# Patient Record
Sex: Male | Born: 1970 | Race: Black or African American | Hispanic: No | Marital: Single | Smoking: Former smoker
Health system: Southern US, Community
[De-identification: ages and names within clinical notes are randomized; demographics above are authoritative.]

## PROBLEM LIST (undated history)

## (undated) DIAGNOSIS — I4729 Other ventricular tachycardia: Secondary | ICD-10-CM

## (undated) DIAGNOSIS — K5090 Crohn's disease, unspecified, without complications: Secondary | ICD-10-CM

## (undated) DIAGNOSIS — D509 Iron deficiency anemia, unspecified: Secondary | ICD-10-CM

## (undated) DIAGNOSIS — I5022 Chronic systolic (congestive) heart failure: Secondary | ICD-10-CM

## (undated) DIAGNOSIS — K5792 Diverticulitis of intestine, part unspecified, without perforation or abscess without bleeding: Secondary | ICD-10-CM

## (undated) DIAGNOSIS — I472 Ventricular tachycardia: Secondary | ICD-10-CM

## (undated) DIAGNOSIS — F1911 Other psychoactive substance abuse, in remission: Secondary | ICD-10-CM

## (undated) DIAGNOSIS — I428 Other cardiomyopathies: Secondary | ICD-10-CM

## (undated) DIAGNOSIS — E669 Obesity, unspecified: Secondary | ICD-10-CM

## (undated) DIAGNOSIS — Z720 Tobacco use: Secondary | ICD-10-CM

## (undated) HISTORY — PX: COLON SURGERY: SHX602

## (undated) HISTORY — PX: OTHER SURGICAL HISTORY: SHX169

---

## 2005-07-14 ENCOUNTER — Emergency Department (HOSPITAL_COMMUNITY): Admission: EM | Admit: 2005-07-14 | Discharge: 2005-07-14 | Payer: Self-pay | Admitting: Emergency Medicine

## 2008-02-08 ENCOUNTER — Emergency Department (HOSPITAL_COMMUNITY): Admission: EM | Admit: 2008-02-08 | Discharge: 2008-02-08 | Payer: Self-pay | Admitting: Emergency Medicine

## 2008-02-14 ENCOUNTER — Inpatient Hospital Stay (HOSPITAL_COMMUNITY): Admission: EM | Admit: 2008-02-14 | Discharge: 2008-02-16 | Payer: Self-pay | Admitting: Emergency Medicine

## 2009-08-27 ENCOUNTER — Emergency Department (HOSPITAL_COMMUNITY): Admission: EM | Admit: 2009-08-27 | Discharge: 2009-08-27 | Payer: Self-pay | Admitting: Emergency Medicine

## 2009-12-15 ENCOUNTER — Encounter: Payer: Self-pay | Admitting: Emergency Medicine

## 2009-12-15 ENCOUNTER — Inpatient Hospital Stay (HOSPITAL_COMMUNITY): Admission: AD | Admit: 2009-12-15 | Discharge: 2009-12-16 | Payer: Self-pay

## 2009-12-20 ENCOUNTER — Emergency Department (HOSPITAL_COMMUNITY): Admission: EM | Admit: 2009-12-20 | Discharge: 2009-12-20 | Payer: Self-pay | Admitting: Emergency Medicine

## 2009-12-25 ENCOUNTER — Inpatient Hospital Stay (HOSPITAL_COMMUNITY): Admission: EM | Admit: 2009-12-25 | Discharge: 2009-12-30 | Payer: Self-pay | Admitting: Emergency Medicine

## 2010-01-01 ENCOUNTER — Encounter: Admission: RE | Admit: 2010-01-01 | Discharge: 2010-01-01 | Payer: Self-pay | Admitting: General Surgery

## 2010-01-01 ENCOUNTER — Inpatient Hospital Stay (HOSPITAL_COMMUNITY): Admission: EM | Admit: 2010-01-01 | Discharge: 2010-02-10 | Payer: Self-pay | Admitting: Emergency Medicine

## 2010-01-11 ENCOUNTER — Encounter (INDEPENDENT_AMBULATORY_CARE_PROVIDER_SITE_OTHER): Payer: Self-pay

## 2010-01-31 ENCOUNTER — Encounter (INDEPENDENT_AMBULATORY_CARE_PROVIDER_SITE_OTHER): Payer: Self-pay

## 2010-02-21 ENCOUNTER — Emergency Department (HOSPITAL_COMMUNITY): Admission: EM | Admit: 2010-02-21 | Discharge: 2010-02-21 | Payer: Self-pay | Admitting: Emergency Medicine

## 2010-03-09 ENCOUNTER — Inpatient Hospital Stay (HOSPITAL_COMMUNITY): Admission: EM | Admit: 2010-03-09 | Discharge: 2010-03-11 | Payer: Self-pay | Admitting: Emergency Medicine

## 2010-03-13 ENCOUNTER — Inpatient Hospital Stay (HOSPITAL_COMMUNITY): Admission: EM | Admit: 2010-03-13 | Discharge: 2010-03-17 | Payer: Self-pay | Admitting: Emergency Medicine

## 2010-03-23 ENCOUNTER — Emergency Department (HOSPITAL_COMMUNITY): Admission: EM | Admit: 2010-03-23 | Discharge: 2010-03-24 | Payer: Self-pay | Admitting: Emergency Medicine

## 2010-03-28 ENCOUNTER — Emergency Department (HOSPITAL_COMMUNITY): Admission: EM | Admit: 2010-03-28 | Discharge: 2010-03-28 | Payer: Self-pay | Admitting: Emergency Medicine

## 2010-04-02 ENCOUNTER — Inpatient Hospital Stay (HOSPITAL_COMMUNITY): Admission: EM | Admit: 2010-04-02 | Discharge: 2010-04-06 | Payer: Self-pay | Admitting: Emergency Medicine

## 2010-04-12 ENCOUNTER — Emergency Department (HOSPITAL_COMMUNITY): Admission: EM | Admit: 2010-04-12 | Discharge: 2010-04-12 | Payer: Self-pay | Admitting: Emergency Medicine

## 2010-04-17 ENCOUNTER — Encounter: Admission: RE | Admit: 2010-04-17 | Discharge: 2010-04-17 | Payer: Self-pay | Admitting: Surgery

## 2010-04-17 ENCOUNTER — Inpatient Hospital Stay (HOSPITAL_COMMUNITY): Admission: EM | Admit: 2010-04-17 | Discharge: 2010-04-19 | Payer: Self-pay | Admitting: Emergency Medicine

## 2010-04-23 ENCOUNTER — Emergency Department (HOSPITAL_COMMUNITY): Admission: EM | Admit: 2010-04-23 | Discharge: 2010-04-24 | Payer: Self-pay | Admitting: Emergency Medicine

## 2010-04-29 ENCOUNTER — Observation Stay (HOSPITAL_COMMUNITY): Admission: EM | Admit: 2010-04-29 | Discharge: 2010-04-30 | Payer: Self-pay | Admitting: Emergency Medicine

## 2010-05-03 ENCOUNTER — Observation Stay (HOSPITAL_COMMUNITY): Admission: EM | Admit: 2010-05-03 | Discharge: 2010-05-04 | Payer: Self-pay | Admitting: Emergency Medicine

## 2010-05-08 ENCOUNTER — Emergency Department (HOSPITAL_COMMUNITY): Admission: EM | Admit: 2010-05-08 | Discharge: 2010-05-08 | Payer: Self-pay | Admitting: Emergency Medicine

## 2010-05-11 ENCOUNTER — Emergency Department (HOSPITAL_COMMUNITY): Admission: EM | Admit: 2010-05-11 | Discharge: 2010-05-11 | Payer: Self-pay | Admitting: Emergency Medicine

## 2010-05-15 ENCOUNTER — Emergency Department (HOSPITAL_COMMUNITY): Admission: EM | Admit: 2010-05-15 | Discharge: 2010-05-15 | Payer: Self-pay | Admitting: Emergency Medicine

## 2010-05-17 ENCOUNTER — Encounter (HOSPITAL_COMMUNITY): Admission: RE | Admit: 2010-05-17 | Discharge: 2010-07-11 | Payer: Self-pay | Admitting: Gastroenterology

## 2010-05-22 ENCOUNTER — Emergency Department (HOSPITAL_COMMUNITY): Admission: EM | Admit: 2010-05-22 | Discharge: 2010-05-22 | Payer: Self-pay | Admitting: Emergency Medicine

## 2010-05-25 ENCOUNTER — Emergency Department (HOSPITAL_COMMUNITY): Admission: EM | Admit: 2010-05-25 | Discharge: 2010-05-26 | Payer: Self-pay | Admitting: Emergency Medicine

## 2010-06-03 ENCOUNTER — Emergency Department (HOSPITAL_COMMUNITY): Admission: EM | Admit: 2010-06-03 | Discharge: 2010-06-03 | Payer: Self-pay | Admitting: Emergency Medicine

## 2010-06-10 ENCOUNTER — Emergency Department (HOSPITAL_COMMUNITY): Admission: EM | Admit: 2010-06-10 | Discharge: 2010-06-10 | Payer: Self-pay | Admitting: Emergency Medicine

## 2010-06-16 ENCOUNTER — Emergency Department (HOSPITAL_COMMUNITY): Admission: EM | Admit: 2010-06-16 | Discharge: 2010-06-16 | Payer: Self-pay | Admitting: Emergency Medicine

## 2010-06-19 ENCOUNTER — Encounter: Admission: RE | Admit: 2010-06-19 | Discharge: 2010-06-19 | Payer: Self-pay | Admitting: Surgery

## 2010-06-22 ENCOUNTER — Emergency Department (HOSPITAL_COMMUNITY): Admission: EM | Admit: 2010-06-22 | Discharge: 2010-06-22 | Payer: Self-pay | Admitting: Emergency Medicine

## 2010-06-27 ENCOUNTER — Inpatient Hospital Stay (HOSPITAL_COMMUNITY): Admission: EM | Admit: 2010-06-27 | Discharge: 2010-06-28 | Payer: Self-pay | Admitting: Emergency Medicine

## 2010-07-01 ENCOUNTER — Emergency Department (HOSPITAL_COMMUNITY): Admission: EM | Admit: 2010-07-01 | Discharge: 2010-07-02 | Payer: Self-pay | Admitting: Emergency Medicine

## 2010-07-04 ENCOUNTER — Emergency Department (HOSPITAL_COMMUNITY): Admission: EM | Admit: 2010-07-04 | Discharge: 2010-07-04 | Payer: Self-pay | Admitting: Emergency Medicine

## 2010-07-06 ENCOUNTER — Emergency Department (HOSPITAL_COMMUNITY): Admission: EM | Admit: 2010-07-06 | Discharge: 2010-07-07 | Payer: Self-pay | Admitting: Emergency Medicine

## 2010-07-12 ENCOUNTER — Emergency Department (HOSPITAL_COMMUNITY): Admission: EM | Admit: 2010-07-12 | Discharge: 2010-07-12 | Payer: Self-pay | Admitting: Emergency Medicine

## 2010-07-21 ENCOUNTER — Emergency Department (HOSPITAL_COMMUNITY): Admission: EM | Admit: 2010-07-21 | Discharge: 2010-07-21 | Payer: Self-pay | Admitting: Emergency Medicine

## 2010-07-26 ENCOUNTER — Inpatient Hospital Stay (HOSPITAL_COMMUNITY): Admission: RE | Admit: 2010-07-26 | Discharge: 2010-08-01 | Payer: Self-pay | Admitting: Surgery

## 2010-07-26 ENCOUNTER — Encounter (INDEPENDENT_AMBULATORY_CARE_PROVIDER_SITE_OTHER): Payer: Self-pay | Admitting: Surgery

## 2010-08-15 ENCOUNTER — Emergency Department (HOSPITAL_COMMUNITY): Admission: EM | Admit: 2010-08-15 | Discharge: 2010-08-16 | Payer: Self-pay | Admitting: Emergency Medicine

## 2010-08-20 ENCOUNTER — Emergency Department (HOSPITAL_COMMUNITY): Admission: EM | Admit: 2010-08-20 | Discharge: 2010-08-20 | Payer: Self-pay | Admitting: Emergency Medicine

## 2010-08-28 ENCOUNTER — Emergency Department (HOSPITAL_COMMUNITY): Admission: EM | Admit: 2010-08-28 | Discharge: 2010-08-29 | Payer: Self-pay | Admitting: Emergency Medicine

## 2010-09-06 ENCOUNTER — Emergency Department (HOSPITAL_COMMUNITY): Admission: EM | Admit: 2010-09-06 | Discharge: 2010-09-06 | Payer: Self-pay | Admitting: Emergency Medicine

## 2010-09-14 ENCOUNTER — Emergency Department (HOSPITAL_COMMUNITY): Admission: EM | Admit: 2010-09-14 | Discharge: 2010-09-14 | Payer: Self-pay | Admitting: Emergency Medicine

## 2010-09-24 ENCOUNTER — Emergency Department (HOSPITAL_COMMUNITY): Admission: EM | Admit: 2010-09-24 | Discharge: 2010-09-25 | Payer: Self-pay | Admitting: Emergency Medicine

## 2010-09-30 ENCOUNTER — Emergency Department (HOSPITAL_COMMUNITY): Admission: EM | Admit: 2010-09-30 | Discharge: 2010-09-30 | Payer: Self-pay | Admitting: Emergency Medicine

## 2010-10-10 ENCOUNTER — Emergency Department (HOSPITAL_COMMUNITY): Admission: EM | Admit: 2010-10-10 | Discharge: 2010-10-10 | Payer: Self-pay | Admitting: Emergency Medicine

## 2010-10-17 ENCOUNTER — Emergency Department (HOSPITAL_COMMUNITY): Admission: EM | Admit: 2010-10-17 | Discharge: 2010-10-17 | Payer: Self-pay | Admitting: Emergency Medicine

## 2010-10-27 ENCOUNTER — Emergency Department (HOSPITAL_COMMUNITY): Admission: EM | Admit: 2010-10-27 | Discharge: 2010-10-27 | Payer: Self-pay | Admitting: Emergency Medicine

## 2010-11-08 ENCOUNTER — Inpatient Hospital Stay (HOSPITAL_COMMUNITY): Admission: EM | Admit: 2010-11-08 | Discharge: 2010-02-19 | Payer: Self-pay | Admitting: Emergency Medicine

## 2010-11-12 ENCOUNTER — Emergency Department (HOSPITAL_COMMUNITY)
Admission: EM | Admit: 2010-11-12 | Discharge: 2010-11-12 | Payer: Self-pay | Source: Home / Self Care | Admitting: Emergency Medicine

## 2010-11-16 ENCOUNTER — Emergency Department (HOSPITAL_COMMUNITY)
Admission: EM | Admit: 2010-11-16 | Discharge: 2010-11-16 | Payer: Self-pay | Source: Home / Self Care | Admitting: Emergency Medicine

## 2010-11-23 ENCOUNTER — Emergency Department (HOSPITAL_COMMUNITY)
Admission: EM | Admit: 2010-11-23 | Discharge: 2010-11-23 | Payer: Self-pay | Source: Home / Self Care | Admitting: Emergency Medicine

## 2010-12-23 ENCOUNTER — Encounter: Payer: Self-pay | Admitting: Surgery

## 2011-02-11 LAB — DIFFERENTIAL
Basophils Relative: 1 % (ref 0–1)
Eosinophils Absolute: 0.3 10*3/uL (ref 0.0–0.7)
Eosinophils Relative: 4 % (ref 0–5)
Lymphocytes Relative: 34 % (ref 12–46)
Lymphs Abs: 1.8 10*3/uL (ref 0.7–4.0)
Lymphs Abs: 2.4 10*3/uL (ref 0.7–4.0)
Monocytes Relative: 13 % — ABNORMAL HIGH (ref 3–12)
Neutro Abs: 2.5 10*3/uL (ref 1.7–7.7)
Neutrophils Relative %: 45 % (ref 43–77)
Neutrophils Relative %: 47 % (ref 43–77)

## 2011-02-11 LAB — COMPREHENSIVE METABOLIC PANEL
ALT: 13 U/L (ref 0–53)
AST: 16 U/L (ref 0–37)
Alkaline Phosphatase: 91 U/L (ref 39–117)
CO2: 24 mEq/L (ref 19–32)
CO2: 27 mEq/L (ref 19–32)
Calcium: 8.6 mg/dL (ref 8.4–10.5)
Calcium: 8.7 mg/dL (ref 8.4–10.5)
Chloride: 105 mEq/L (ref 96–112)
Creatinine, Ser: 0.87 mg/dL (ref 0.4–1.5)
GFR calc Af Amer: >60 mL/min (ref >60)
GFR calc non Af Amer: >60 mL/min (ref >60)
GFR calc non Af Amer: >60 mL/min (ref >60)
Glucose, Bld: 90 mg/dL (ref 70–99)
Glucose, Bld: 93 mg/dL (ref 70–99)
Potassium: 3 mEq/L — ABNORMAL LOW (ref 3.5–5.1)
Sodium: 139 mEq/L (ref 135–145)
Total Protein: 7 g/dL (ref 6.0–8.3)

## 2011-02-11 LAB — CBC
HCT: 36.4 % — ABNORMAL LOW (ref 39.0–52.0)
HCT: 36.5 % — ABNORMAL LOW (ref 39.0–52.0)
Hemoglobin: 11.3 g/dL — ABNORMAL LOW (ref 13.0–17.0)
Hemoglobin: 11.5 g/dL — ABNORMAL LOW (ref 13.0–17.0)
MCH: 24.6 pg — ABNORMAL LOW (ref 26.0–34.0)
MCHC: 31 g/dL (ref 30.0–36.0)
MCHC: 31.6 g/dL (ref 30.0–36.0)
RBC: 4.67 MIL/uL (ref 4.22–5.81)
RDW: 15.6 % — ABNORMAL HIGH (ref 11.5–15.5)
WBC: 6.3 10*3/uL (ref 4.0–10.5)

## 2011-02-11 LAB — LIPASE, BLOOD
Lipase: 44 U/L (ref 11–59)
Lipase: 63 U/L — ABNORMAL HIGH (ref 11–59)

## 2011-02-12 LAB — CBC
HCT: 35.7 % — ABNORMAL LOW (ref 39.0–52.0)
HCT: 36.7 % — ABNORMAL LOW (ref 39.0–52.0)
HCT: 38.6 % — ABNORMAL LOW (ref 39.0–52.0)
Hemoglobin: 11 g/dL — ABNORMAL LOW (ref 13.0–17.0)
Hemoglobin: 11.2 g/dL — ABNORMAL LOW (ref 13.0–17.0)
MCH: 24.8 pg — ABNORMAL LOW (ref 26.0–34.0)
MCHC: 30 g/dL (ref 30.0–36.0)
MCV: 78.9 fL (ref 78.0–100.0)
MCV: 81 fL (ref 78.0–100.0)
Platelets: 379 10*3/uL (ref 150–400)
RBC: 4.6 MIL/uL (ref 4.22–5.81)
RDW: 14.8 % (ref 11.5–15.5)
RDW: 15.5 % (ref 11.5–15.5)
WBC: 4.4 10*3/uL (ref 4.0–10.5)
WBC: 5.6 10*3/uL (ref 4.0–10.5)
WBC: 6.1 10*3/uL (ref 4.0–10.5)

## 2011-02-12 LAB — URINALYSIS, ROUTINE W REFLEX MICROSCOPIC
Bilirubin Urine: NEGATIVE
Bilirubin Urine: NEGATIVE
Glucose, UA: NEGATIVE mg/dL
Glucose, UA: NEGATIVE mg/dL
Hgb urine dipstick: NEGATIVE
Ketones, ur: NEGATIVE mg/dL
Ketones, ur: NEGATIVE mg/dL
Ketones, ur: NEGATIVE mg/dL
Leukocytes, UA: NEGATIVE
Leukocytes, UA: NEGATIVE
Nitrite: NEGATIVE
Nitrite: NEGATIVE
Protein, ur: NEGATIVE mg/dL
Protein, ur: NEGATIVE mg/dL
Specific Gravity, Urine: 1.027 (ref 1.005–1.030)
Specific Gravity, Urine: 1.03 (ref 1.005–1.030)
Urobilinogen, UA: 1 mg/dL (ref 0.0–1.0)
pH: 5.5 (ref 5.0–8.0)
pH: 6 (ref 5.0–8.0)
pH: 6 (ref 5.0–8.0)

## 2011-02-12 LAB — COMPREHENSIVE METABOLIC PANEL
ALT: 11 U/L (ref 0–53)
ALT: 16 U/L (ref 0–53)
AST: 18 U/L (ref 0–37)
AST: 23 U/L (ref 0–37)
Albumin: 3.7 g/dL (ref 3.5–5.2)
Alkaline Phosphatase: 100 U/L (ref 39–117)
Alkaline Phosphatase: 100 U/L (ref 39–117)
Alkaline Phosphatase: 122 U/L — ABNORMAL HIGH (ref 39–117)
BUN: 5 mg/dL — ABNORMAL LOW (ref 6–23)
BUN: 6 mg/dL (ref 6–23)
CO2: 24 mEq/L (ref 19–32)
CO2: 26 mEq/L (ref 19–32)
Calcium: 8.8 mg/dL (ref 8.4–10.5)
Chloride: 104 mEq/L (ref 96–112)
Creatinine, Ser: 0.91 mg/dL (ref 0.4–1.5)
GFR calc Af Amer: >60 mL/min (ref >60)
GFR calc Af Amer: >60 mL/min (ref >60)
GFR calc non Af Amer: >60 mL/min (ref >60)
GFR calc non Af Amer: >60 mL/min (ref >60)
Glucose, Bld: 100 mg/dL — ABNORMAL HIGH (ref 70–99)
Glucose, Bld: 104 mg/dL — ABNORMAL HIGH (ref 70–99)
Glucose, Bld: 130 mg/dL — ABNORMAL HIGH (ref 70–99)
Glucose, Bld: 91 mg/dL (ref 70–99)
Potassium: 2.9 mEq/L — ABNORMAL LOW (ref 3.5–5.1)
Potassium: 3 mEq/L — ABNORMAL LOW (ref 3.5–5.1)
Potassium: 3.4 mEq/L — ABNORMAL LOW (ref 3.5–5.1)
Sodium: 137 mEq/L (ref 135–145)
Sodium: 141 mEq/L (ref 135–145)
Total Bilirubin: 0.9 mg/dL (ref 0.3–1.2)
Total Bilirubin: 1.2 mg/dL (ref 0.3–1.2)
Total Protein: 6.7 g/dL (ref 6.0–8.3)
Total Protein: 6.8 g/dL (ref 6.0–8.3)
Total Protein: 6.9 g/dL (ref 6.0–8.3)

## 2011-02-12 LAB — URINE MICROSCOPIC-ADD ON

## 2011-02-12 LAB — DIFFERENTIAL
Basophils Absolute: 0.1 10*3/uL (ref 0.0–0.1)
Basophils Absolute: 0.1 10*3/uL (ref 0.0–0.1)
Basophils Relative: 1 % (ref 0–1)
Basophils Relative: 2 % — ABNORMAL HIGH (ref 0–1)
Basophils Relative: 2 % — ABNORMAL HIGH (ref 0–1)
Eosinophils Absolute: 0.3 10*3/uL (ref 0.0–0.7)
Eosinophils Absolute: 0.3 10*3/uL (ref 0.0–0.7)
Eosinophils Relative: 5 % (ref 0–5)
Eosinophils Relative: 7 % — ABNORMAL HIGH (ref 0–5)
Lymphocytes Relative: 29 % (ref 12–46)
Lymphs Abs: 1.3 10*3/uL (ref 0.7–4.0)
Lymphs Abs: 1.4 10*3/uL (ref 0.7–4.0)
Lymphs Abs: 1.8 10*3/uL (ref 0.7–4.0)
Monocytes Relative: 10 % (ref 3–12)
Monocytes Relative: 14 % — ABNORMAL HIGH (ref 3–12)
Neutro Abs: 2.4 10*3/uL (ref 1.7–7.7)
Neutro Abs: 2.8 10*3/uL (ref 1.7–7.7)
Neutrophils Relative %: 45 % (ref 43–77)
Neutrophils Relative %: 47 % (ref 43–77)
Neutrophils Relative %: 50 % (ref 43–77)

## 2011-02-12 LAB — LIPASE, BLOOD
Lipase: 54 U/L (ref 11–59)
Lipase: 64 U/L — ABNORMAL HIGH (ref 11–59)
Lipase: 70 U/L — ABNORMAL HIGH (ref 11–59)

## 2011-02-13 LAB — DIFFERENTIAL
Basophils Relative: 1 % (ref 0–1)
Eosinophils Absolute: 0.3 10*3/uL (ref 0.0–0.7)
Eosinophils Relative: 7 % — ABNORMAL HIGH (ref 0–5)
Lymphs Abs: 1.4 10*3/uL (ref 0.7–4.0)

## 2011-02-13 LAB — POCT I-STAT, CHEM 8
BUN: 4 mg/dL — ABNORMAL LOW (ref 6–23)
Calcium, Ion: 1.08 mmol/L — ABNORMAL LOW (ref 1.12–1.32)
Creatinine, Ser: 0.8 mg/dL (ref 0.4–1.5)
Creatinine, Ser: 0.9 mg/dL (ref 0.4–1.5)
Glucose, Bld: 131 mg/dL — ABNORMAL HIGH (ref 70–99)
Hemoglobin: 14.3 g/dL (ref 13.0–17.0)
Sodium: 139 mEq/L (ref 135–145)
TCO2: 25 mmol/L (ref 0–100)
TCO2: 26 mmol/L (ref 0–100)

## 2011-02-13 LAB — CBC
MCH: 26.2 pg (ref 26.0–34.0)
MCHC: 32 g/dL (ref 30.0–36.0)
MCV: 81.8 fL (ref 78.0–100.0)
Platelets: 297 10*3/uL (ref 150–400)
RBC: 4.51 MIL/uL (ref 4.22–5.81)
RDW: 14.3 % (ref 11.5–15.5)

## 2011-02-13 LAB — URINALYSIS, ROUTINE W REFLEX MICROSCOPIC
Bilirubin Urine: NEGATIVE
Hgb urine dipstick: NEGATIVE
Nitrite: NEGATIVE
Protein, ur: 30 mg/dL — AB
Protein, ur: NEGATIVE mg/dL
Specific Gravity, Urine: 1.022 (ref 1.005–1.030)
Urobilinogen, UA: 0.2 mg/dL (ref 0.0–1.0)
Urobilinogen, UA: 1 mg/dL (ref 0.0–1.0)

## 2011-02-13 LAB — URINE CULTURE
Colony Count: NO GROWTH
Culture: NO GROWTH

## 2011-02-13 LAB — URINE MICROSCOPIC-ADD ON

## 2011-02-13 IMAGING — CR DG CHEST 1V
1 series · 1 of 1 positions shown · non-contrast
Comparison: Same day

CLINICAL DATA: Followup left thoracentesis.

CHEST - 1 VIEW

[w chest pa]
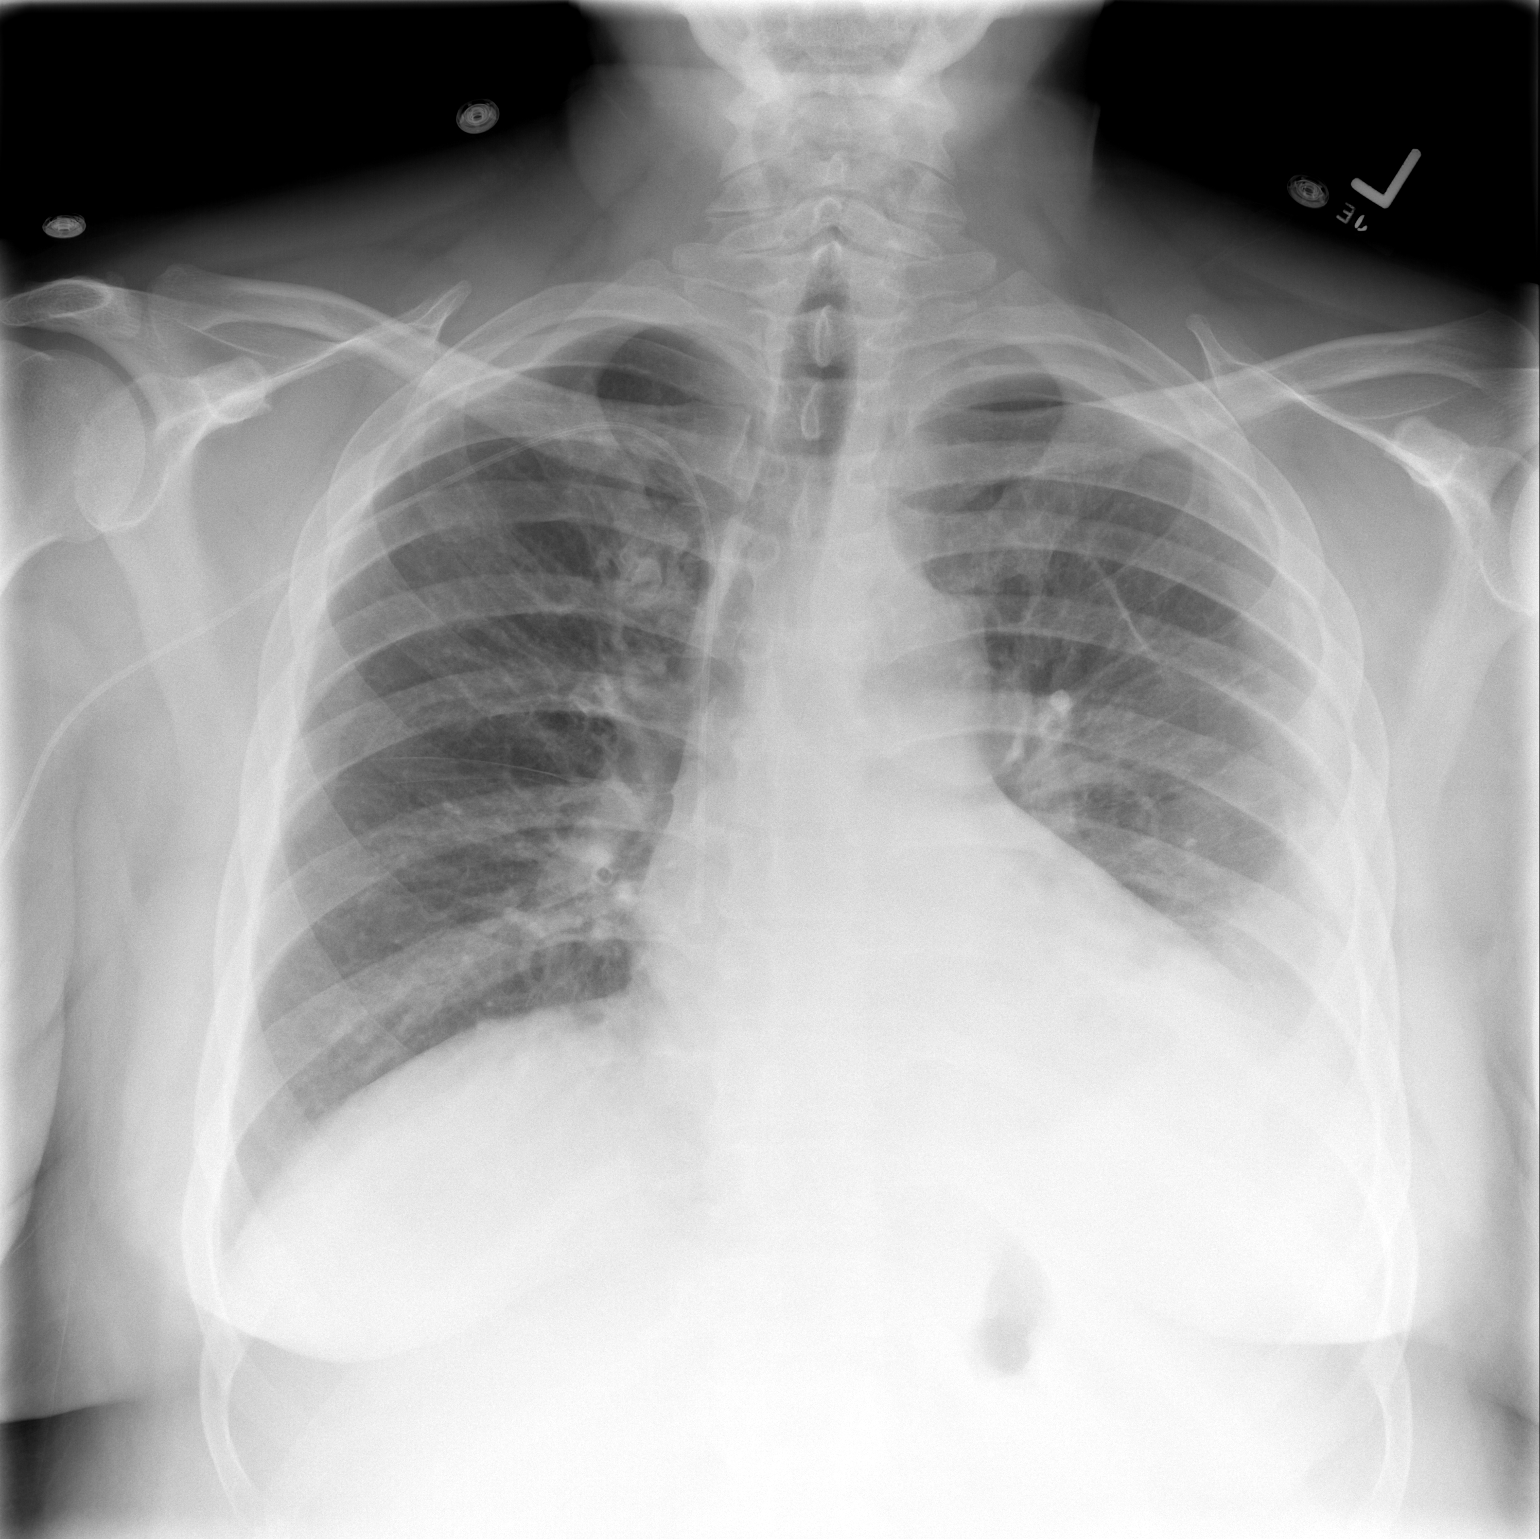

[1 of 1 positions shown; findings below may reference images not displayed]

FINDINGS: There is less pleural fluid on the left.  No
pneumothorax.  There is better aeration of the left lower lobe.
Right chest remains clear.  The catheter has its tip at the SVC/RA
junction or just within the right atrium.
IMPRESSION: Less pleural fluid on the left.  No pneumothorax.

## 2011-02-13 IMAGING — US US PARACENTESIS
1 series · 7 of 7 positions shown · non-contrast
Comparison: none

CLINICAL DATA: Left pleural effusion

LEFT ULTRASOUND-GUIDED THORACENTESIS
TECHNIQUE: An ultrasound-guided thoracentesis was thoroughly
discussed with the patient and questions answered.  The benefits,
risks, alternatives and complications were also discussed.  The
patient understands and wishes to proceed with the procedure.  A
verbal as well as written consent was obtained. Ultrasound was
performed to localize and mark an adequate pocket of fluid for
thoracentesis.  The left chest wall was prepped and draped in the
normal sterile fashion.  1% Lidocaine was used for local
anesthesia.  Under ultrasound guidance a 19-gauge Yueh catheter was
introduced yielding approximately 800 ml of blood-tinged fluid.
The patient tolerated the procedure well and there were no
immediate complications. Post procedure chest x-ray is pending.

[Series 1: us paracentesis · 0.31mm/px · 7 of 7 slices shown]
[im 1/7]
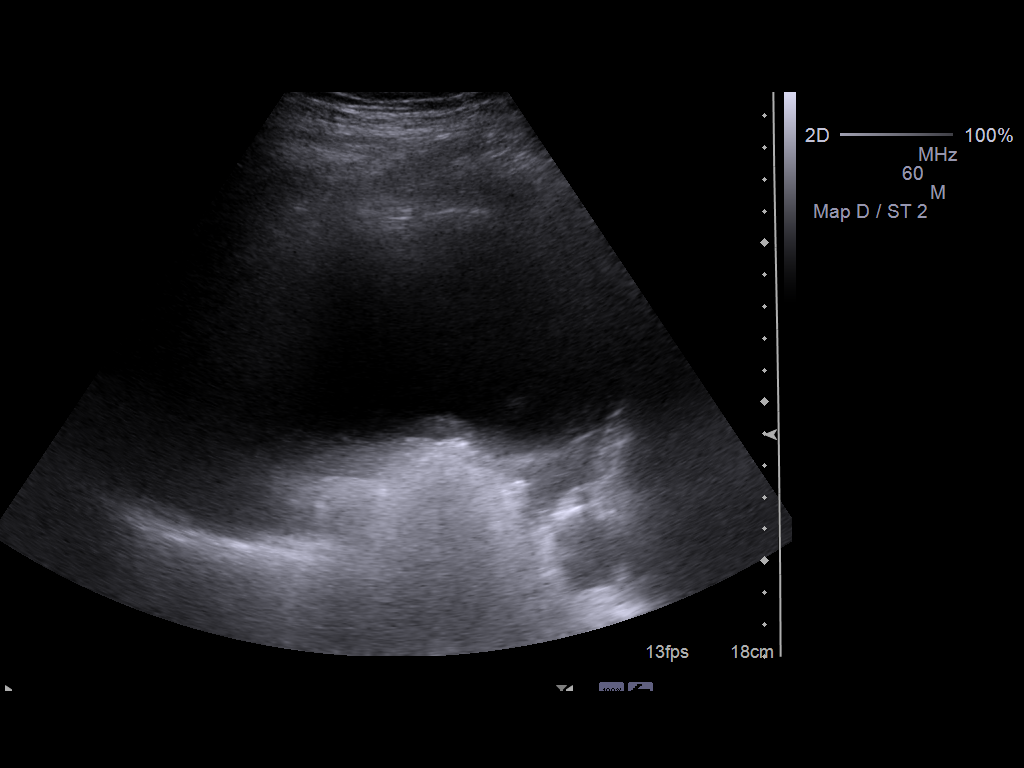
[im 2/7]
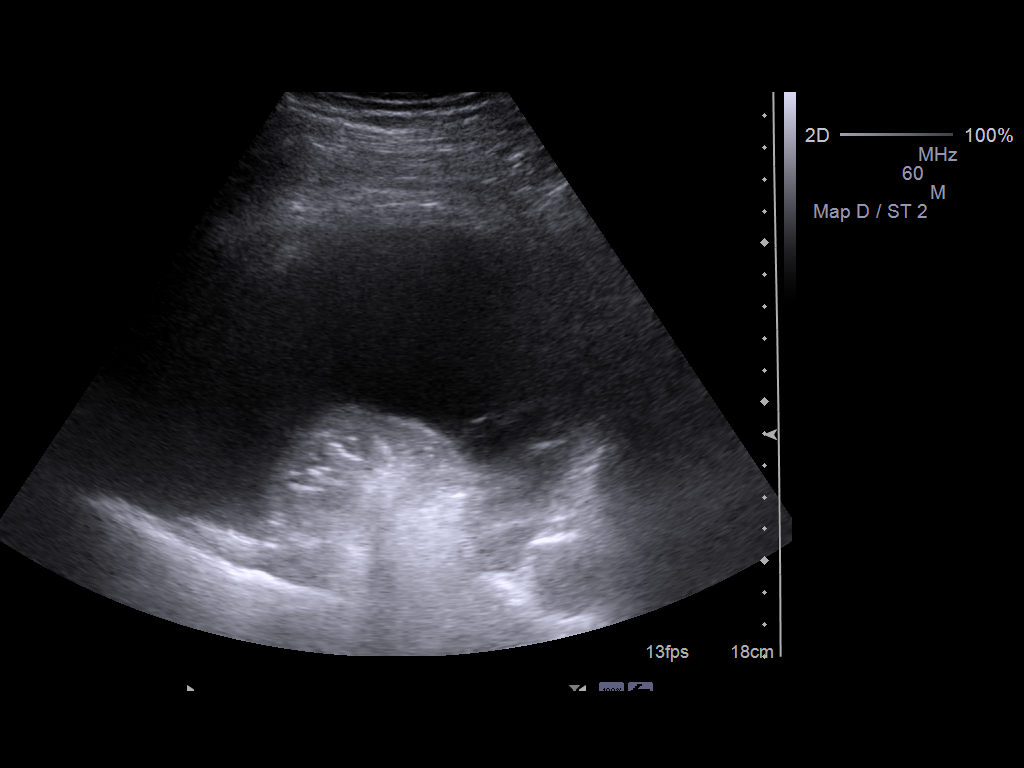
[im 3/7]
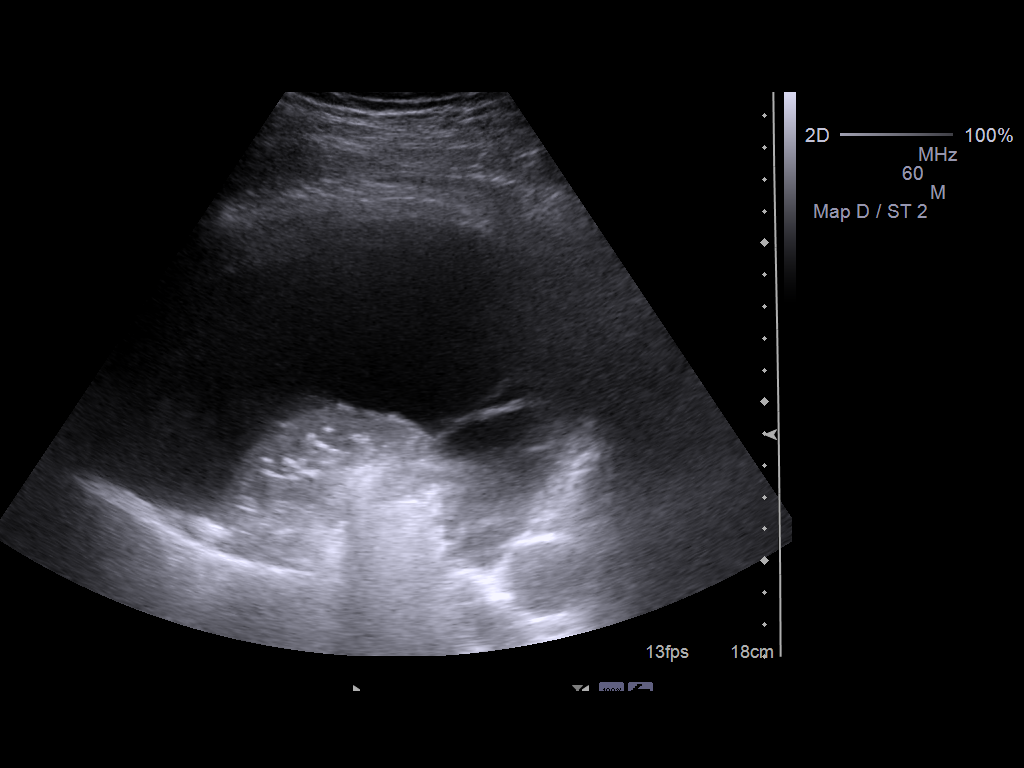
[im 4/7]
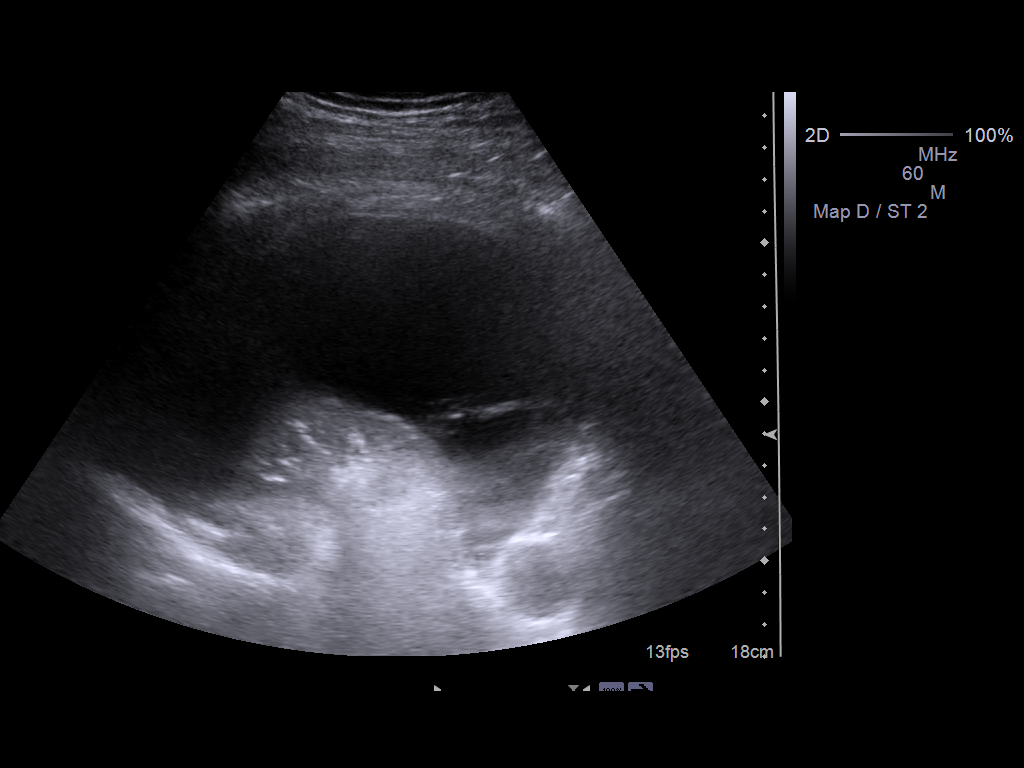
[im 5/7]
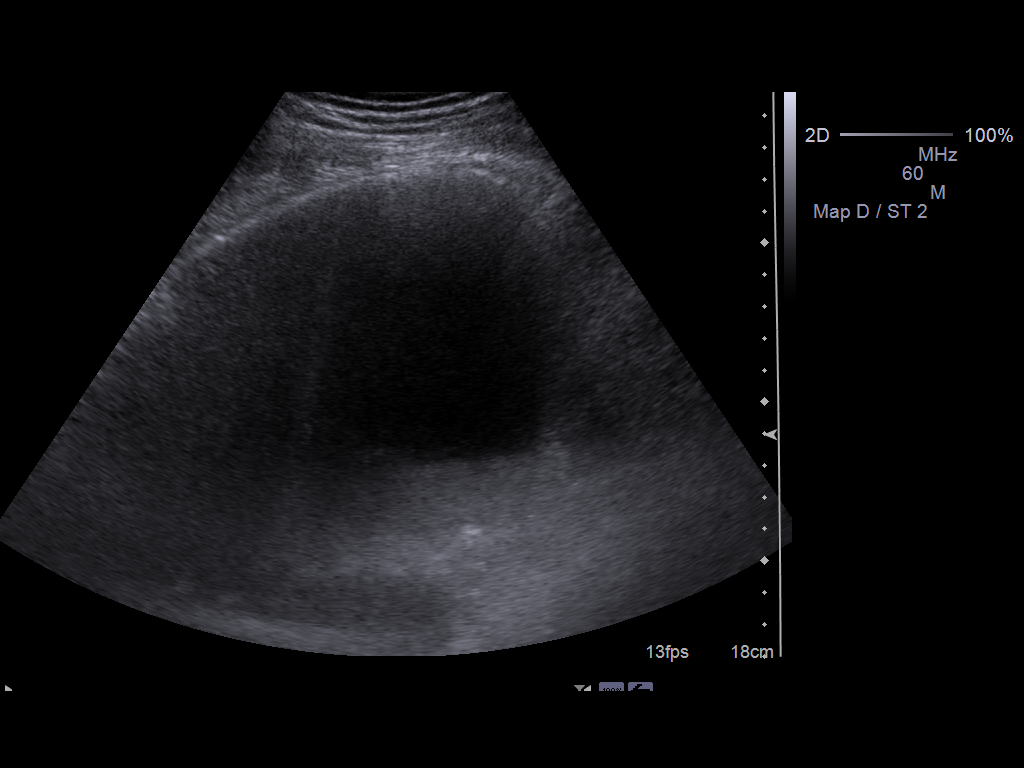
[im 6/7]
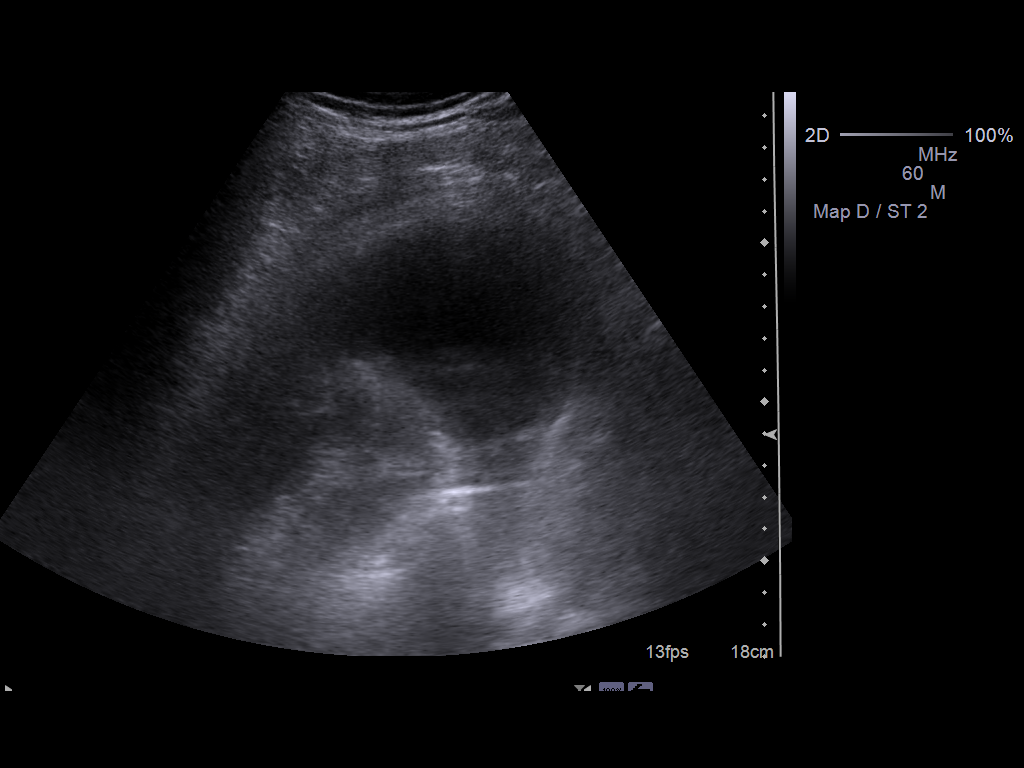
[im 7/7]
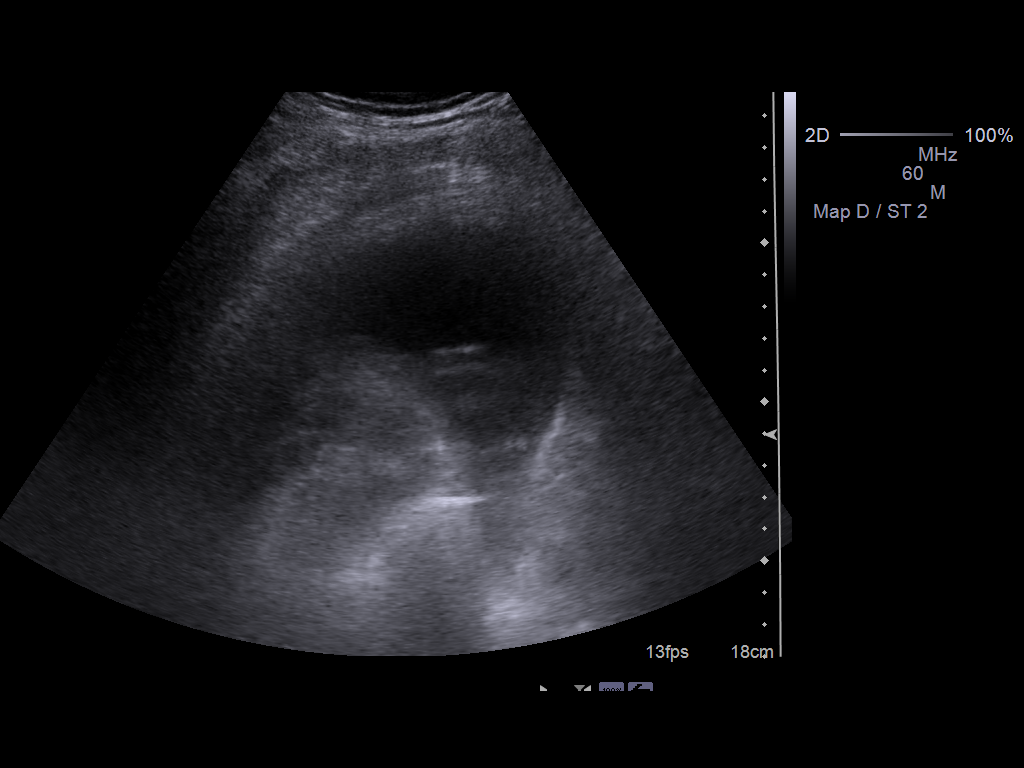

[7 of 7 positions shown; findings below may reference images not displayed]

IMPRESSION: Successful ultrasound-guided left thoracentesis yielding 800 ml of
fluid.

Read by: Loyd, Suly.-NIL

## 2011-02-13 IMAGING — CR DG CHEST 2V
2 series · 2 of 2 positions shown · non-contrast
Comparison: Chest 01/15/2010 and CT abdomen and pelvis 01/30/2010.

CLINICAL DATA: Productive cough.  Perforated diverticulum.

CHEST - 2 VIEW

[w chest pa *]
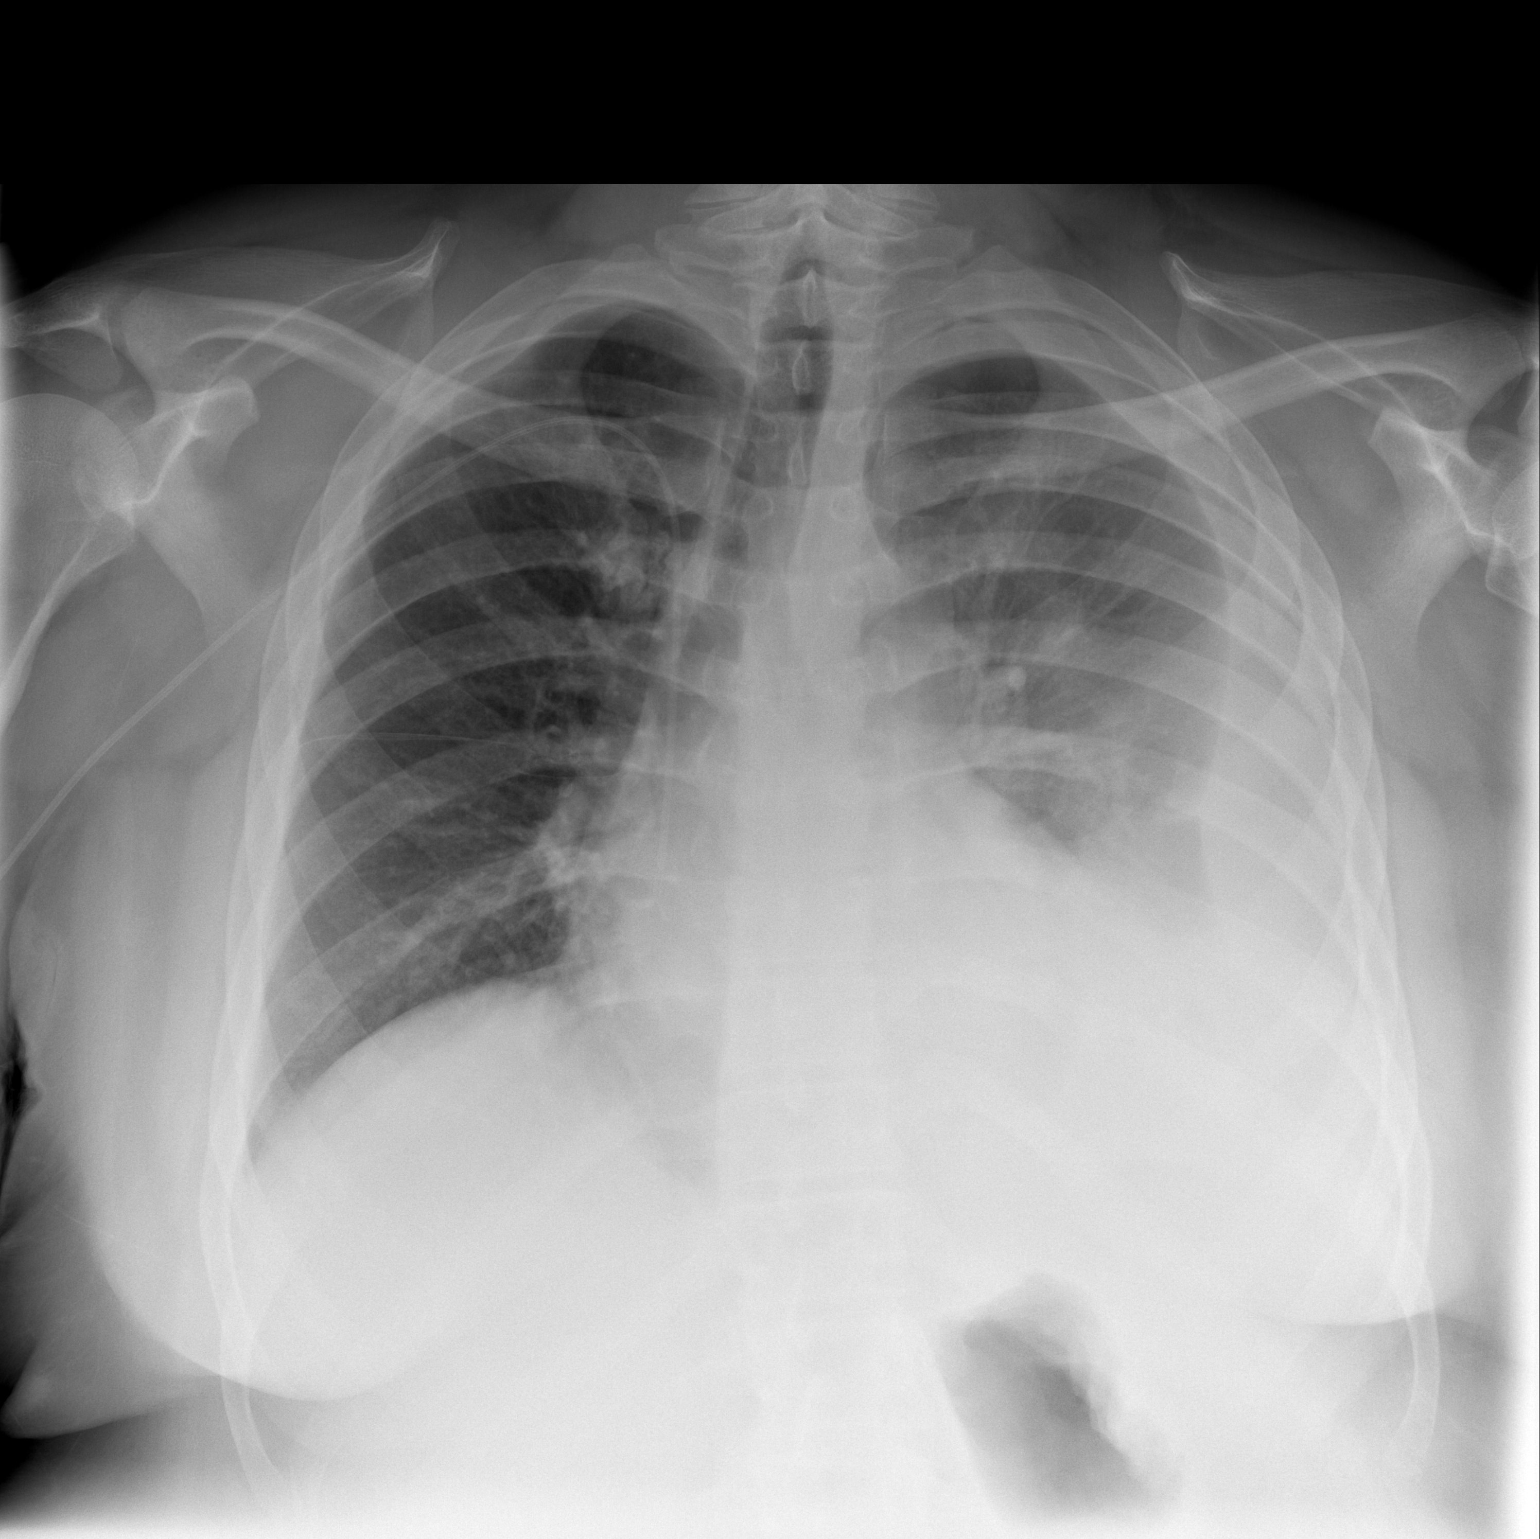

[w chest lat]
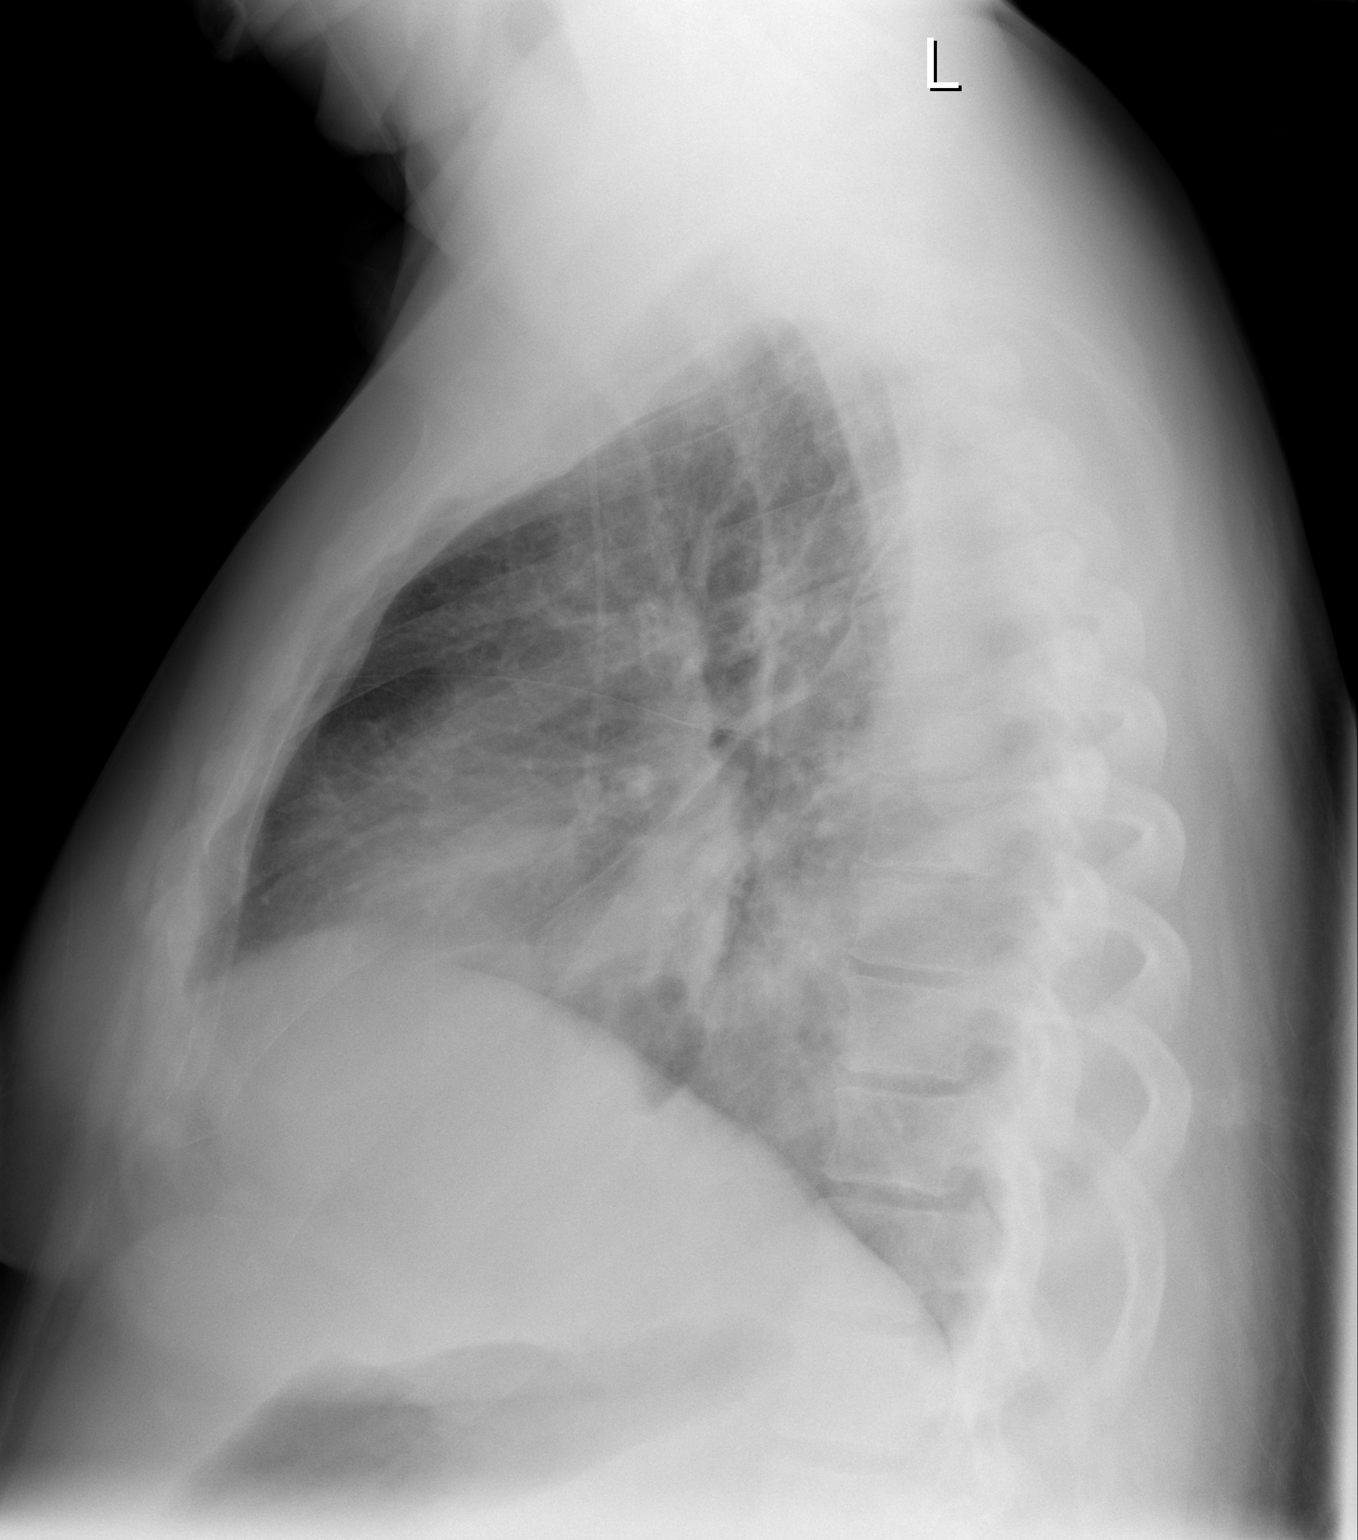

[2 of 2 positions shown; findings below may reference images not displayed]

FINDINGS: The patient has a large left pleural effusion with
basilar airspace disease as seen on CT scan.  Right lung appears
clear.  Heart size is upper normal.  Right PICC in place.
IMPRESSION: Large left pleural effusion with basilar airspace disease.

## 2011-02-14 LAB — DIFFERENTIAL
Basophils Absolute: 0.1 10*3/uL (ref 0.0–0.1)
Basophils Absolute: 0.1 10*3/uL (ref 0.0–0.1)
Basophils Absolute: 0.1 10*3/uL (ref 0.0–0.1)
Eosinophils Absolute: 0.3 10*3/uL (ref 0.0–0.7)
Eosinophils Absolute: 0.3 10*3/uL (ref 0.0–0.7)
Eosinophils Relative: 5 % (ref 0–5)
Lymphocytes Relative: 35 % (ref 12–46)
Lymphocytes Relative: 37 % (ref 12–46)
Lymphocytes Relative: 41 % (ref 12–46)
Lymphs Abs: 2.2 10*3/uL (ref 0.7–4.0)
Lymphs Abs: 2.8 10*3/uL (ref 0.7–4.0)
Monocytes Absolute: 0.6 10*3/uL (ref 0.1–1.0)
Monocytes Absolute: 0.8 10*3/uL (ref 0.1–1.0)
Monocytes Relative: 10 % (ref 3–12)
Monocytes Relative: 11 % (ref 3–12)
Neutro Abs: 2.5 10*3/uL (ref 1.7–7.7)
Neutrophils Relative %: 44 % (ref 43–77)
Neutrophils Relative %: 48 % (ref 43–77)
Neutrophils Relative %: 52 % (ref 43–77)

## 2011-02-14 LAB — LIPASE, BLOOD: Lipase: 72 U/L — ABNORMAL HIGH (ref 11–59)

## 2011-02-14 LAB — COMPREHENSIVE METABOLIC PANEL
ALT: 8 U/L (ref 0–53)
ALT: <8 U/L (ref 0–53)
AST: 13 U/L (ref 0–37)
Albumin: 3.6 g/dL (ref 3.5–5.2)
Albumin: 3.6 g/dL (ref 3.5–5.2)
Alkaline Phosphatase: 78 U/L (ref 39–117)
BUN: 3 mg/dL — ABNORMAL LOW (ref 6–23)
CO2: 26 mEq/L (ref 19–32)
Calcium: 8.5 mg/dL (ref 8.4–10.5)
Calcium: 9.1 mg/dL (ref 8.4–10.5)
Chloride: 105 mEq/L (ref 96–112)
Creatinine, Ser: 0.91 mg/dL (ref 0.4–1.5)
Creatinine, Ser: 1 mg/dL (ref 0.4–1.5)
GFR calc Af Amer: >60 mL/min (ref >60)
GFR calc Af Amer: >60 mL/min (ref >60)
GFR calc non Af Amer: >60 mL/min (ref >60)
Glucose, Bld: 90 mg/dL (ref 70–99)
Glucose, Bld: 95 mg/dL (ref 70–99)
Potassium: 3.3 mEq/L — ABNORMAL LOW (ref 3.5–5.1)
Sodium: 140 mEq/L (ref 135–145)
Total Bilirubin: 0.8 mg/dL (ref 0.3–1.2)
Total Protein: 6.7 g/dL (ref 6.0–8.3)
Total Protein: 6.9 g/dL (ref 6.0–8.3)

## 2011-02-14 LAB — CBC
HCT: 35 % — ABNORMAL LOW (ref 39.0–52.0)
HCT: 35.5 % — ABNORMAL LOW (ref 39.0–52.0)
Hemoglobin: 11.3 g/dL — ABNORMAL LOW (ref 13.0–17.0)
Hemoglobin: 11.4 g/dL — ABNORMAL LOW (ref 13.0–17.0)
MCH: 26.5 pg (ref 26.0–34.0)
MCH: 28 pg (ref 26.0–34.0)
MCHC: 31.5 g/dL (ref 30.0–36.0)
MCHC: 32.3 g/dL (ref 30.0–36.0)
MCV: 84.1 fL (ref 78.0–100.0)
MCV: 86.8 fL (ref 78.0–100.0)
Platelets: 276 10*3/uL (ref 150–400)
Platelets: 363 10*3/uL (ref 150–400)
Platelets: 380 10*3/uL (ref 150–400)
RBC: 4.03 MIL/uL — ABNORMAL LOW (ref 4.22–5.81)
RDW: 14.1 % (ref 11.5–15.5)
RDW: 14.2 % (ref 11.5–15.5)
RDW: 14.3 % (ref 11.5–15.5)
WBC: 5.7 10*3/uL (ref 4.0–10.5)
WBC: 7 10*3/uL (ref 4.0–10.5)

## 2011-02-14 LAB — URINALYSIS, ROUTINE W REFLEX MICROSCOPIC
Glucose, UA: NEGATIVE mg/dL
Glucose, UA: NEGATIVE mg/dL
Hgb urine dipstick: NEGATIVE
Ketones, ur: NEGATIVE mg/dL
Leukocytes, UA: NEGATIVE
Leukocytes, UA: NEGATIVE
Leukocytes, UA: NEGATIVE
Nitrite: NEGATIVE
Protein, ur: NEGATIVE mg/dL
Protein, ur: NEGATIVE mg/dL
Specific Gravity, Urine: 1.029 (ref 1.005–1.030)
Urobilinogen, UA: 0.2 mg/dL (ref 0.0–1.0)
Urobilinogen, UA: 1 mg/dL (ref 0.0–1.0)
pH: 6.5 (ref 5.0–8.0)

## 2011-02-14 LAB — URINE MICROSCOPIC-ADD ON

## 2011-02-14 LAB — POCT I-STAT, CHEM 8
BUN: <3 mg/dL — ABNORMAL LOW (ref 6–23)
Calcium, Ion: 1.14 mmol/L (ref 1.12–1.32)
Chloride: 105 mEq/L (ref 96–112)
Glucose, Bld: 106 mg/dL — ABNORMAL HIGH (ref 70–99)
HCT: 41 % (ref 39.0–52.0)
Potassium: 3 mEq/L — ABNORMAL LOW (ref 3.5–5.1)

## 2011-02-14 LAB — CULTURE, ROUTINE-ABSCESS

## 2011-02-14 LAB — GRAM STAIN

## 2011-02-14 LAB — HEPATIC FUNCTION PANEL
ALT: <8 U/L (ref 0–53)
Albumin: 3.6 g/dL (ref 3.5–5.2)
Alkaline Phosphatase: 70 U/L (ref 39–117)
Total Protein: 6.8 g/dL (ref 6.0–8.3)

## 2011-02-15 LAB — LIPASE, BLOOD: Lipase: 26 U/L (ref 11–59)

## 2011-02-15 LAB — CBC
HCT: 35.3 % — ABNORMAL LOW (ref 39.0–52.0)
Hemoglobin: 12.2 g/dL — ABNORMAL LOW (ref 13.0–17.0)
Hemoglobin: 12.5 g/dL — ABNORMAL LOW (ref 13.0–17.0)
Hemoglobin: 12.7 g/dL — ABNORMAL LOW (ref 13.0–17.0)
MCH: 28.6 pg (ref 26.0–34.0)
MCH: 27.4 pg (ref 26.0–34.0)
MCH: 28.4 pg (ref 26.0–34.0)
MCHC: 32.1 g/dL (ref 30.0–36.0)
MCHC: 32.5 g/dL (ref 30.0–36.0)
MCHC: 32.9 g/dL (ref 30.0–36.0)
MCV: 87.4 fL (ref 78.0–100.0)
MCV: 88.4 fL (ref 78.0–100.0)
MCV: 89 fL (ref 78.0–100.0)
Platelets: 245 10*3/uL (ref 150–400)
Platelets: 255 10*3/uL (ref 150–400)
Platelets: 300 10*3/uL (ref 150–400)
Platelets: 303 10*3/uL (ref 150–400)
RBC: 4.04 MIL/uL — ABNORMAL LOW (ref 4.22–5.81)
RBC: 4.27 MIL/uL (ref 4.22–5.81)
RBC: 4.57 MIL/uL (ref 4.22–5.81)
RDW: 15.5 % (ref 11.5–15.5)
RDW: 16.1 % — ABNORMAL HIGH (ref 11.5–15.5)
RDW: 18.6 % — ABNORMAL HIGH (ref 11.5–15.5)
WBC: 11.3 10*3/uL — ABNORMAL HIGH (ref 4.0–10.5)
WBC: 4.5 10*3/uL (ref 4.0–10.5)
WBC: 7.6 10*3/uL (ref 4.0–10.5)

## 2011-02-15 LAB — URINALYSIS, ROUTINE W REFLEX MICROSCOPIC
Bilirubin Urine: NEGATIVE
Glucose, UA: NEGATIVE mg/dL
Glucose, UA: NEGATIVE mg/dL
Hgb urine dipstick: NEGATIVE
Ketones, ur: NEGATIVE mg/dL
Nitrite: NEGATIVE
Protein, ur: NEGATIVE mg/dL
Specific Gravity, Urine: 1.024 (ref 1.005–1.030)
Specific Gravity, Urine: 1.028 (ref 1.005–1.030)
Urobilinogen, UA: 1 mg/dL (ref 0.0–1.0)
pH: 5.5 (ref 5.0–8.0)
pH: 6 (ref 5.0–8.0)

## 2011-02-15 LAB — DIFFERENTIAL
Basophils Absolute: 0.1 10*3/uL (ref 0.0–0.1)
Basophils Absolute: 0.1 10*3/uL (ref 0.0–0.1)
Basophils Relative: 1 % (ref 0–1)
Basophils Relative: 2 % — ABNORMAL HIGH (ref 0–1)
Basophils Relative: 1 % (ref 0–1)
Eosinophils Absolute: 0.4 10*3/uL (ref 0.0–0.7)
Eosinophils Absolute: 0.4 10*3/uL (ref 0.0–0.7)
Eosinophils Relative: 6 % — ABNORMAL HIGH (ref 0–5)
Lymphocytes Relative: 33 % (ref 12–46)
Lymphocytes Relative: 42 % (ref 12–46)
Lymphs Abs: 2.5 10*3/uL (ref 0.7–4.0)
Lymphs Abs: 2.6 10*3/uL (ref 0.7–4.0)
Monocytes Absolute: 0.5 10*3/uL (ref 0.1–1.0)
Monocytes Absolute: 0.7 10*3/uL (ref 0.1–1.0)
Monocytes Relative: 11 % (ref 3–12)
Monocytes Relative: 10 % (ref 3–12)
Monocytes Relative: 9 % (ref 3–12)
Monocytes Relative: 11 % (ref 3–12)
Neutro Abs: 2.1 10*3/uL (ref 1.7–7.7)
Neutro Abs: 2.5 10*3/uL (ref 1.7–7.7)
Neutro Abs: 2.4 10*3/uL (ref 1.7–7.7)
Neutro Abs: 3.8 10*3/uL (ref 1.7–7.7)
Neutro Abs: 3.7 10*3/uL (ref 1.7–7.7)
Neutrophils Relative %: 39 % — ABNORMAL LOW (ref 43–77)
Neutrophils Relative %: 51 % (ref 43–77)

## 2011-02-15 LAB — COMPREHENSIVE METABOLIC PANEL
ALT: 20 U/L (ref 0–53)
AST: 22 U/L (ref 0–37)
Albumin: 3.9 g/dL (ref 3.5–5.2)
Albumin: 4.1 g/dL (ref 3.5–5.2)
Alkaline Phosphatase: 65 U/L (ref 39–117)
Alkaline Phosphatase: 87 U/L (ref 39–117)
Alkaline Phosphatase: 95 U/L (ref 39–117)
BUN: 3 mg/dL — ABNORMAL LOW (ref 6–23)
BUN: 4 mg/dL — ABNORMAL LOW (ref 6–23)
BUN: 5 mg/dL — ABNORMAL LOW (ref 6–23)
BUN: 5 mg/dL — ABNORMAL LOW (ref 6–23)
CO2: 23 mEq/L (ref 19–32)
Calcium: 8.2 mg/dL — ABNORMAL LOW (ref 8.4–10.5)
Calcium: 8.4 mg/dL (ref 8.4–10.5)
Chloride: 105 mEq/L (ref 96–112)
Chloride: 106 mEq/L (ref 96–112)
Creatinine, Ser: 0.96 mg/dL (ref 0.4–1.5)
GFR calc Af Amer: >60 mL/min (ref >60)
GFR calc non Af Amer: >60 mL/min (ref >60)
GFR calc non Af Amer: >60 mL/min (ref >60)
Glucose, Bld: 100 mg/dL — ABNORMAL HIGH (ref 70–99)
Potassium: 3.1 mEq/L — ABNORMAL LOW (ref 3.5–5.1)
Potassium: 3.4 mEq/L — ABNORMAL LOW (ref 3.5–5.1)
Potassium: 3.4 mEq/L — ABNORMAL LOW (ref 3.5–5.1)
Sodium: 141 mEq/L (ref 135–145)
Sodium: 133 mEq/L — ABNORMAL LOW (ref 135–145)
Total Bilirubin: 1.1 mg/dL (ref 0.3–1.2)
Total Bilirubin: 2.1 mg/dL — ABNORMAL HIGH (ref 0.3–1.2)
Total Protein: 6.7 g/dL (ref 6.0–8.3)
Total Protein: 7.2 g/dL (ref 6.0–8.3)

## 2011-02-15 LAB — BASIC METABOLIC PANEL
BUN: 3 mg/dL — ABNORMAL LOW (ref 6–23)
BUN: 3 mg/dL — ABNORMAL LOW (ref 6–23)
CO2: 26 mEq/L (ref 19–32)
CO2: 24 mEq/L (ref 19–32)
Calcium: 7.7 mg/dL — ABNORMAL LOW (ref 8.4–10.5)
Calcium: 8.9 mg/dL (ref 8.4–10.5)
Calcium: 8.7 mg/dL (ref 8.4–10.5)
Chloride: 99 mEq/L (ref 96–112)
Creatinine, Ser: 0.86 mg/dL (ref 0.4–1.5)
Creatinine, Ser: 0.82 mg/dL (ref 0.4–1.5)
GFR calc Af Amer: >60 mL/min (ref >60)
GFR calc Af Amer: >60 mL/min (ref >60)
GFR calc Af Amer: >60 mL/min (ref >60)
GFR calc Af Amer: >60 mL/min (ref >60)
GFR calc non Af Amer: >60 mL/min (ref >60)
GFR calc non Af Amer: >60 mL/min (ref >60)
GFR calc non Af Amer: >60 mL/min (ref >60)
Glucose, Bld: 115 mg/dL — ABNORMAL HIGH (ref 70–99)
Potassium: 2.6 mEq/L — CL (ref 3.5–5.1)
Potassium: 2.9 mEq/L — ABNORMAL LOW (ref 3.5–5.1)
Sodium: 135 mEq/L (ref 135–145)
Sodium: 139 mEq/L (ref 135–145)
Sodium: 137 mEq/L (ref 135–145)
Sodium: 135 mEq/L (ref 135–145)

## 2011-02-15 LAB — URINE MICROSCOPIC-ADD ON

## 2011-02-15 LAB — RAPID URINE DRUG SCREEN, HOSP PERFORMED
Amphetamines: NOT DETECTED
Cocaine: NOT DETECTED
Opiates: POSITIVE — AB
Tetrahydrocannabinol: NOT DETECTED

## 2011-02-15 LAB — ACETAMINOPHEN LEVEL: Acetaminophen (Tylenol), Serum: 12.3 ug/mL (ref 10–30)

## 2011-02-15 LAB — D-DIMER, QUANTITATIVE: D-Dimer, Quant: 0.28 ug/mL-FEU (ref 0.00–0.48)

## 2011-02-16 LAB — DIFFERENTIAL
Basophils Relative: 0 % (ref 0–1)
Basophils Relative: 1 % (ref 0–1)
Basophils Relative: 1 % (ref 0–1)
Eosinophils Absolute: 0.3 10*3/uL (ref 0.0–0.7)
Eosinophils Absolute: 0.3 10*3/uL (ref 0.0–0.7)
Eosinophils Absolute: 0.4 10*3/uL (ref 0.0–0.7)
Eosinophils Relative: 8 % — ABNORMAL HIGH (ref 0–5)
Lymphs Abs: 1.7 10*3/uL (ref 0.7–4.0)
Lymphs Abs: 2.1 10*3/uL (ref 0.7–4.0)
Monocytes Absolute: 0.7 10*3/uL (ref 0.1–1.0)
Monocytes Absolute: 0.7 10*3/uL (ref 0.1–1.0)
Monocytes Relative: 11 % (ref 3–12)
Monocytes Relative: 12 % (ref 3–12)
Neutro Abs: 2.2 10*3/uL (ref 1.7–7.7)
Neutro Abs: 3.8 10*3/uL (ref 1.7–7.7)
Neutrophils Relative %: 54 % (ref 43–77)
Neutrophils Relative %: 59 % (ref 43–77)

## 2011-02-16 LAB — CBC
HCT: 36.8 % — ABNORMAL LOW (ref 39.0–52.0)
Hemoglobin: 11.2 g/dL — ABNORMAL LOW (ref 13.0–17.0)
Hemoglobin: 11.5 g/dL — ABNORMAL LOW (ref 13.0–17.0)
Hemoglobin: 12.1 g/dL — ABNORMAL LOW (ref 13.0–17.0)
MCH: 28.1 pg (ref 26.0–34.0)
MCH: 28.2 pg (ref 26.0–34.0)
MCHC: 32.4 g/dL (ref 30.0–36.0)
MCV: 87.1 fL (ref 78.0–100.0)
Platelets: 317 10*3/uL (ref 150–400)
RBC: 3.98 MIL/uL — ABNORMAL LOW (ref 4.22–5.81)
RBC: 4.08 MIL/uL — ABNORMAL LOW (ref 4.22–5.81)
RBC: 4.23 MIL/uL (ref 4.22–5.81)
RDW: 19 % — ABNORMAL HIGH (ref 11.5–15.5)
WBC: 6.6 10*3/uL (ref 4.0–10.5)
WBC: 7.1 10*3/uL (ref 4.0–10.5)

## 2011-02-16 LAB — POCT I-STAT, CHEM 8
Chloride: 101 mEq/L (ref 96–112)
Creatinine, Ser: 0.8 mg/dL (ref 0.4–1.5)
Glucose, Bld: 103 mg/dL — ABNORMAL HIGH (ref 70–99)
HCT: 40 % (ref 39.0–52.0)
Hemoglobin: 13.6 g/dL (ref 13.0–17.0)
Potassium: 3.3 mEq/L — ABNORMAL LOW (ref 3.5–5.1)
Sodium: 141 mEq/L (ref 135–145)

## 2011-02-16 LAB — COMPREHENSIVE METABOLIC PANEL
ALT: 15 U/L (ref 0–53)
Alkaline Phosphatase: 71 U/L (ref 39–117)
Chloride: 105 mEq/L (ref 96–112)
Glucose, Bld: 100 mg/dL — ABNORMAL HIGH (ref 70–99)
Potassium: 3.1 mEq/L — ABNORMAL LOW (ref 3.5–5.1)
Sodium: 138 mEq/L (ref 135–145)
Total Bilirubin: 1.8 mg/dL — ABNORMAL HIGH (ref 0.3–1.2)
Total Protein: 6.5 g/dL (ref 6.0–8.3)

## 2011-02-16 LAB — URINALYSIS, ROUTINE W REFLEX MICROSCOPIC
Bilirubin Urine: NEGATIVE
Glucose, UA: NEGATIVE mg/dL
Ketones, ur: NEGATIVE mg/dL
Nitrite: NEGATIVE
Protein, ur: NEGATIVE mg/dL
Specific Gravity, Urine: 1.031 — ABNORMAL HIGH (ref 1.005–1.030)
Urobilinogen, UA: 0.2 mg/dL (ref 0.0–1.0)
Urobilinogen, UA: 0.2 mg/dL (ref 0.0–1.0)
pH: 5.5 (ref 5.0–8.0)

## 2011-02-16 LAB — BASIC METABOLIC PANEL
CO2: 27 mEq/L (ref 19–32)
CO2: 29 mEq/L (ref 19–32)
Calcium: 8.4 mg/dL (ref 8.4–10.5)
Calcium: 8.7 mg/dL (ref 8.4–10.5)
Chloride: 107 mEq/L (ref 96–112)
Creatinine, Ser: 0.78 mg/dL (ref 0.4–1.5)
GFR calc Af Amer: >60 mL/min (ref >60)
Glucose, Bld: 100 mg/dL — ABNORMAL HIGH (ref 70–99)
Glucose, Bld: 94 mg/dL (ref 70–99)
Sodium: 142 mEq/L (ref 135–145)

## 2011-02-16 LAB — URINE MICROSCOPIC-ADD ON

## 2011-02-17 LAB — CBC
HCT: 35.5 % — ABNORMAL LOW (ref 39.0–52.0)
HCT: 36.9 % — ABNORMAL LOW (ref 39.0–52.0)
HCT: 43.5 % (ref 39.0–52.0)
HCT: 36.9 % — ABNORMAL LOW (ref 39.0–52.0)
Hemoglobin: 11.4 g/dL — ABNORMAL LOW (ref 13.0–17.0)
Hemoglobin: 11.9 g/dL — ABNORMAL LOW (ref 13.0–17.0)
Hemoglobin: 14.6 g/dL (ref 13.0–17.0)
Hemoglobin: 14.4 g/dL (ref 13.0–17.0)
Hemoglobin: 15.2 g/dL (ref 13.0–17.0)
MCH: 27.6 pg (ref 26.0–34.0)
MCHC: 32.2 g/dL (ref 30.0–36.0)
MCHC: 33.5 g/dL (ref 30.0–36.0)
MCHC: 32.7 g/dL (ref 30.0–36.0)
MCHC: 33.1 g/dL (ref 30.0–36.0)
MCHC: 33.5 g/dL (ref 30.0–36.0)
MCHC: 32.4 g/dL (ref 30.0–36.0)
MCV: 86 fL (ref 78.0–100.0)
MCV: 95.6 fL (ref 78.0–100.0)
MCV: 93.5 fL (ref 78.0–100.0)
MCV: 94.3 fL (ref 78.0–100.0)
MCV: 95.1 fL (ref 78.0–100.0)
MCV: 95.1 fL (ref 78.0–100.0)
MCV: 84.7 fL (ref 78.0–100.0)
Platelets: 338 10*3/uL (ref 150–400)
Platelets: 426 10*3/uL — ABNORMAL HIGH (ref 150–400)
RBC: 4.13 MIL/uL — ABNORMAL LOW (ref 4.22–5.81)
RBC: 4.55 MIL/uL (ref 4.22–5.81)
RBC: 3.96 MIL/uL — ABNORMAL LOW (ref 4.22–5.81)
RBC: 4.05 MIL/uL — ABNORMAL LOW (ref 4.22–5.81)
RBC: 4.62 MIL/uL (ref 4.22–5.81)
RBC: 4.88 MIL/uL (ref 4.22–5.81)
RBC: 4.35 MIL/uL (ref 4.22–5.81)
RDW: 14.6 % (ref 11.5–15.5)
WBC: 6.8 10*3/uL (ref 4.0–10.5)
WBC: 10.5 10*3/uL (ref 4.0–10.5)
WBC: 6.7 10*3/uL (ref 4.0–10.5)
WBC: 10.6 10*3/uL — ABNORMAL HIGH (ref 4.0–10.5)
WBC: 4.7 10*3/uL (ref 4.0–10.5)

## 2011-02-17 LAB — COMPREHENSIVE METABOLIC PANEL
ALT: 21 U/L (ref 0–53)
ALT: 13 U/L (ref 0–53)
ALT: 9 U/L (ref 0–53)
AST: 18 U/L (ref 0–37)
AST: 12 U/L (ref 0–37)
AST: 26 U/L (ref 0–37)
Albumin: 3.3 g/dL — ABNORMAL LOW (ref 3.5–5.2)
Alkaline Phosphatase: 90 U/L (ref 39–117)
Alkaline Phosphatase: 84 U/L (ref 39–117)
BUN: 5 mg/dL — ABNORMAL LOW (ref 6–23)
BUN: 4 mg/dL — ABNORMAL LOW (ref 6–23)
CO2: 29 mEq/L (ref 19–32)
CO2: 25 mEq/L (ref 19–32)
CO2: 24 mEq/L (ref 19–32)
CO2: 26 mEq/L (ref 19–32)
Calcium: 8.3 mg/dL — ABNORMAL LOW (ref 8.4–10.5)
Calcium: 8.9 mg/dL (ref 8.4–10.5)
Calcium: 8.7 mg/dL (ref 8.4–10.5)
Calcium: 8.5 mg/dL (ref 8.4–10.5)
Chloride: 105 mEq/L (ref 96–112)
Chloride: 101 mEq/L (ref 96–112)
Chloride: 107 mEq/L (ref 96–112)
Creatinine, Ser: 1.03 mg/dL (ref 0.4–1.5)
Creatinine, Ser: 1.1 mg/dL (ref 0.4–1.5)
Creatinine, Ser: 0.98 mg/dL (ref 0.4–1.5)
GFR calc Af Amer: >60 mL/min (ref >60)
GFR calc Af Amer: >60 mL/min (ref >60)
GFR calc Af Amer: >60 mL/min (ref >60)
GFR calc non Af Amer: >60 mL/min (ref >60)
GFR calc non Af Amer: >60 mL/min (ref >60)
GFR calc non Af Amer: >60 mL/min (ref >60)
GFR calc non Af Amer: >60 mL/min (ref >60)
Glucose, Bld: 83 mg/dL (ref 70–99)
Glucose, Bld: 94 mg/dL (ref 70–99)
Glucose, Bld: 138 mg/dL — ABNORMAL HIGH (ref 70–99)
Potassium: 3.6 mEq/L (ref 3.5–5.1)
Potassium: 3.3 mEq/L — ABNORMAL LOW (ref 3.5–5.1)
Sodium: 140 mEq/L (ref 135–145)
Sodium: 140 mEq/L (ref 135–145)
Sodium: 136 mEq/L (ref 135–145)
Total Bilirubin: 0.9 mg/dL (ref 0.3–1.2)
Total Bilirubin: 0.8 mg/dL (ref 0.3–1.2)
Total Bilirubin: 0.7 mg/dL (ref 0.3–1.2)
Total Protein: 7.5 g/dL (ref 6.0–8.3)

## 2011-02-17 LAB — URINALYSIS, ROUTINE W REFLEX MICROSCOPIC
Bilirubin Urine: NEGATIVE
Bilirubin Urine: NEGATIVE
Glucose, UA: NEGATIVE mg/dL
Glucose, UA: NEGATIVE mg/dL
Hgb urine dipstick: NEGATIVE
Ketones, ur: 15 mg/dL — AB
Ketones, ur: NEGATIVE mg/dL
Ketones, ur: 15 mg/dL — AB
Leukocytes, UA: NEGATIVE
Leukocytes, UA: NEGATIVE
Nitrite: NEGATIVE
Nitrite: POSITIVE — AB
Nitrite: NEGATIVE
Protein, ur: NEGATIVE mg/dL
Protein, ur: NEGATIVE mg/dL
Protein, ur: NEGATIVE mg/dL
Protein, ur: NEGATIVE mg/dL
Specific Gravity, Urine: 1.029 (ref 1.005–1.030)
Specific Gravity, Urine: 1.027 (ref 1.005–1.030)
Specific Gravity, Urine: 1.038 — ABNORMAL HIGH (ref 1.005–1.030)
Specific Gravity, Urine: 1.024 (ref 1.005–1.030)
Specific Gravity, Urine: 1.026 (ref 1.005–1.030)
Urobilinogen, UA: 0.2 mg/dL (ref 0.0–1.0)
Urobilinogen, UA: 0.2 mg/dL (ref 0.0–1.0)
Urobilinogen, UA: 0.2 mg/dL (ref 0.0–1.0)
Urobilinogen, UA: 0.2 mg/dL (ref 0.0–1.0)
Urobilinogen, UA: 0.2 mg/dL (ref 0.0–1.0)
pH: 5.5 (ref 5.0–8.0)

## 2011-02-17 LAB — BASIC METABOLIC PANEL
BUN: 4 mg/dL — ABNORMAL LOW (ref 6–23)
CO2: 25 mEq/L (ref 19–32)
CO2: 26 mEq/L (ref 19–32)
Calcium: 8.5 mg/dL (ref 8.4–10.5)
Chloride: 102 mEq/L (ref 96–112)
Chloride: 101 mEq/L (ref 96–112)
Chloride: 97 mEq/L (ref 96–112)
Creatinine, Ser: 0.94 mg/dL (ref 0.4–1.5)
Creatinine, Ser: 1.09 mg/dL (ref 0.4–1.5)
GFR calc Af Amer: >60 mL/min (ref >60)
GFR calc Af Amer: >60 mL/min (ref >60)
Potassium: 3.3 mEq/L — ABNORMAL LOW (ref 3.5–5.1)
Sodium: 134 mEq/L — ABNORMAL LOW (ref 135–145)

## 2011-02-17 LAB — DIFFERENTIAL
Basophils Absolute: 0 10*3/uL (ref 0.0–0.1)
Basophils Absolute: 0.1 10*3/uL (ref 0.0–0.1)
Basophils Absolute: 0 10*3/uL (ref 0.0–0.1)
Basophils Relative: 0 % (ref 0–1)
Eosinophils Absolute: 0 10*3/uL (ref 0.0–0.7)
Eosinophils Relative: 5 % (ref 0–5)
Eosinophils Relative: 6 % — ABNORMAL HIGH (ref 0–5)
Eosinophils Relative: 2 % (ref 0–5)
Eosinophils Relative: 5 % (ref 0–5)
Lymphocytes Relative: 32 % (ref 12–46)
Lymphocytes Relative: 15 % (ref 12–46)
Lymphocytes Relative: 11 % — ABNORMAL LOW (ref 12–46)
Lymphocytes Relative: 29 % (ref 12–46)
Lymphs Abs: 2.2 10*3/uL (ref 0.7–4.0)
Lymphs Abs: 1.2 10*3/uL (ref 0.7–4.0)
Lymphs Abs: 1.4 10*3/uL (ref 0.7–4.0)
Lymphs Abs: 1.9 10*3/uL (ref 0.7–4.0)
Monocytes Absolute: 0.6 10*3/uL (ref 0.1–1.0)
Monocytes Absolute: 0.7 10*3/uL (ref 0.1–1.0)
Monocytes Absolute: 0.9 10*3/uL (ref 0.1–1.0)
Monocytes Relative: 9 % (ref 3–12)
Monocytes Relative: 7 % (ref 3–12)
Neutro Abs: 8 10*3/uL — ABNORMAL HIGH (ref 1.7–7.7)
Neutro Abs: 8.7 10*3/uL — ABNORMAL HIGH (ref 1.7–7.7)
Neutrophils Relative %: 54 % (ref 43–77)
Neutrophils Relative %: 76 % (ref 43–77)
Neutrophils Relative %: 82 % — ABNORMAL HIGH (ref 43–77)
Neutrophils Relative %: 59 % (ref 43–77)

## 2011-02-17 LAB — URINE MICROSCOPIC-ADD ON

## 2011-02-17 LAB — POCT I-STAT, CHEM 8
BUN: 4 mg/dL — ABNORMAL LOW (ref 6–23)
Calcium, Ion: 1 mmol/L — ABNORMAL LOW (ref 1.12–1.32)
Calcium, Ion: 1.11 mmol/L — ABNORMAL LOW (ref 1.12–1.32)
Chloride: 108 mEq/L (ref 96–112)
Creatinine, Ser: 0.8 mg/dL (ref 0.4–1.5)
Creatinine, Ser: 0.9 mg/dL (ref 0.4–1.5)
Glucose, Bld: 89 mg/dL (ref 70–99)
HCT: 41 % (ref 39.0–52.0)
Hemoglobin: 13.9 g/dL (ref 13.0–17.0)
Potassium: 3.3 mEq/L — ABNORMAL LOW (ref 3.5–5.1)
TCO2: 21 mmol/L (ref 0–100)

## 2011-02-17 LAB — APTT: aPTT: 35 seconds (ref 24–37)

## 2011-02-17 LAB — CULTURE, ROUTINE-ABSCESS

## 2011-02-17 LAB — LIPASE, BLOOD
Lipase: 33 U/L (ref 11–59)
Lipase: 26 U/L (ref 11–59)
Lipase: 19 U/L (ref 11–59)
Lipase: 26 U/L (ref 11–59)

## 2011-02-17 LAB — URINE CULTURE: Colony Count: >=100000

## 2011-02-17 LAB — PROTIME-INR
INR: 1.02 (ref 0.00–1.49)
Prothrombin Time: 13.3 seconds (ref 11.6–15.2)

## 2011-02-17 LAB — GC/CHLAMYDIA PROBE AMP, GENITAL: GC Probe Amp, Genital: NEGATIVE

## 2011-02-17 LAB — MAGNESIUM: Magnesium: 2 mg/dL (ref 1.5–2.5)

## 2011-02-18 LAB — CBC
HCT: 45.8 % (ref 39.0–52.0)
HCT: 35.9 % — ABNORMAL LOW (ref 39.0–52.0)
HCT: 45.6 % (ref 39.0–52.0)
HCT: 33.3 % — ABNORMAL LOW (ref 39.0–52.0)
HCT: 40.6 % (ref 39.0–52.0)
Hemoglobin: 11.6 g/dL — ABNORMAL LOW (ref 13.0–17.0)
Hemoglobin: 12.7 g/dL — ABNORMAL LOW (ref 13.0–17.0)
Hemoglobin: 13.6 g/dL (ref 13.0–17.0)
MCHC: 32.6 g/dL (ref 30.0–36.0)
MCHC: 33 g/dL (ref 30.0–36.0)
MCHC: 32.5 g/dL (ref 30.0–36.0)
MCHC: 32.5 g/dL (ref 30.0–36.0)
MCHC: 32.7 g/dL (ref 30.0–36.0)
MCHC: 32.9 g/dL (ref 30.0–36.0)
MCV: 82.3 fL (ref 78.0–100.0)
MCV: 82.3 fL (ref 78.0–100.0)
MCV: 81.8 fL (ref 78.0–100.0)
MCV: 81.9 fL (ref 78.0–100.0)
Platelets: 411 10*3/uL — ABNORMAL HIGH (ref 150–400)
Platelets: 332 10*3/uL (ref 150–400)
Platelets: 417 10*3/uL — ABNORMAL HIGH (ref 150–400)
RBC: 4.37 MIL/uL (ref 4.22–5.81)
RBC: 4.37 MIL/uL (ref 4.22–5.81)
RBC: 5.54 MIL/uL (ref 4.22–5.81)
RBC: 4.96 MIL/uL (ref 4.22–5.81)
RBC: 5.15 MIL/uL (ref 4.22–5.81)
RBC: 5.4 MIL/uL (ref 4.22–5.81)
RDW: 14.4 % (ref 11.5–15.5)
RDW: 21.2 % — ABNORMAL HIGH (ref 11.5–15.5)
RDW: 20.7 % — ABNORMAL HIGH (ref 11.5–15.5)
RDW: 20.9 % — ABNORMAL HIGH (ref 11.5–15.5)
WBC: 7.8 10*3/uL (ref 4.0–10.5)
WBC: 8.4 10*3/uL (ref 4.0–10.5)
WBC: 9.3 10*3/uL (ref 4.0–10.5)
WBC: 10 10*3/uL (ref 4.0–10.5)
WBC: 9.1 10*3/uL (ref 4.0–10.5)

## 2011-02-18 LAB — RAPID URINE DRUG SCREEN, HOSP PERFORMED
Amphetamines: NOT DETECTED
Barbiturates: NOT DETECTED
Benzodiazepines: NOT DETECTED
Cocaine: NOT DETECTED
Opiates: NOT DETECTED
Tetrahydrocannabinol: POSITIVE — AB

## 2011-02-18 LAB — URINALYSIS, ROUTINE W REFLEX MICROSCOPIC
Bilirubin Urine: NEGATIVE
Bilirubin Urine: NEGATIVE
Glucose, UA: NEGATIVE mg/dL
Glucose, UA: NEGATIVE mg/dL
Glucose, UA: NEGATIVE mg/dL
Glucose, UA: NEGATIVE mg/dL
Glucose, UA: NEGATIVE mg/dL
Hgb urine dipstick: NEGATIVE
Ketones, ur: 15 mg/dL — AB
Ketones, ur: 15 mg/dL — AB
Ketones, ur: 15 mg/dL — AB
Ketones, ur: 15 mg/dL — AB
Leukocytes, UA: NEGATIVE
Leukocytes, UA: NEGATIVE
Leukocytes, UA: NEGATIVE
Nitrite: NEGATIVE
Nitrite: NEGATIVE
Nitrite: NEGATIVE
Nitrite: NEGATIVE
Nitrite: NEGATIVE
Protein, ur: 30 mg/dL — AB
Protein, ur: 100 mg/dL — AB
Protein, ur: 30 mg/dL — AB
Protein, ur: NEGATIVE mg/dL
Protein, ur: 30 mg/dL — AB
Specific Gravity, Urine: 1.028 (ref 1.005–1.030)
Specific Gravity, Urine: 1.034 — ABNORMAL HIGH (ref 1.005–1.030)
Specific Gravity, Urine: >1.046 — ABNORMAL HIGH (ref 1.005–1.030)
Urobilinogen, UA: 0.2 mg/dL (ref 0.0–1.0)
Urobilinogen, UA: 1 mg/dL (ref 0.0–1.0)
pH: 5.5 (ref 5.0–8.0)
pH: 5.5 (ref 5.0–8.0)
pH: 5.5 (ref 5.0–8.0)
pH: 6 (ref 5.0–8.0)
pH: 5.5 (ref 5.0–8.0)
pH: 6 (ref 5.0–8.0)

## 2011-02-18 LAB — LIPASE, BLOOD
Lipase: 65 U/L — ABNORMAL HIGH (ref 11–59)
Lipase: 47 U/L (ref 11–59)
Lipase: 58 U/L (ref 11–59)

## 2011-02-18 LAB — POCT I-STAT, CHEM 8
BUN: 29 mg/dL — ABNORMAL HIGH (ref 6–23)
Calcium, Ion: 1.25 mmol/L (ref 1.12–1.32)
Calcium, Ion: 1.18 mmol/L (ref 1.12–1.32)
Creatinine, Ser: 1.7 mg/dL — ABNORMAL HIGH (ref 0.4–1.5)
Creatinine, Ser: 2.1 mg/dL — ABNORMAL HIGH (ref 0.4–1.5)
Glucose, Bld: 136 mg/dL — ABNORMAL HIGH (ref 70–99)
Glucose, Bld: 97 mg/dL (ref 70–99)
HCT: 50 % (ref 39.0–52.0)
Hemoglobin: 13.6 g/dL (ref 13.0–17.0)
TCO2: 17 mmol/L (ref 0–100)
TCO2: 21 mmol/L (ref 0–100)
TCO2: 22 mmol/L (ref 0–100)

## 2011-02-18 LAB — DIFFERENTIAL
Basophils Absolute: 0.1 10*3/uL (ref 0.0–0.1)
Basophils Absolute: 0.1 10*3/uL (ref 0.0–0.1)
Basophils Absolute: 0.1 10*3/uL (ref 0.0–0.1)
Basophils Relative: 1 % (ref 0–1)
Basophils Relative: 1 % (ref 0–1)
Eosinophils Absolute: 0.3 10*3/uL (ref 0.0–0.7)
Eosinophils Absolute: 0.2 10*3/uL (ref 0.0–0.7)
Eosinophils Absolute: 0.3 10*3/uL (ref 0.0–0.7)
Eosinophils Absolute: 0.2 10*3/uL (ref 0.0–0.7)
Eosinophils Absolute: 0.3 10*3/uL (ref 0.0–0.7)
Eosinophils Relative: 3 % (ref 0–5)
Eosinophils Relative: 3 % (ref 0–5)
Eosinophils Relative: 4 % (ref 0–5)
Eosinophils Relative: 4 % (ref 0–5)
Eosinophils Relative: 3 % (ref 0–5)
Eosinophils Relative: 3 % (ref 0–5)
Lymphocytes Relative: 17 % (ref 12–46)
Lymphocytes Relative: 33 % (ref 12–46)
Lymphocytes Relative: 29 % (ref 12–46)
Lymphs Abs: 2.5 10*3/uL (ref 0.7–4.0)
Lymphs Abs: 2.7 10*3/uL (ref 0.7–4.0)
Monocytes Absolute: 0.6 10*3/uL (ref 0.1–1.0)
Monocytes Absolute: 0.8 10*3/uL (ref 0.1–1.0)
Monocytes Absolute: 0.8 10*3/uL (ref 0.1–1.0)
Monocytes Relative: 10 % (ref 3–12)
Monocytes Relative: 6 % (ref 3–12)
Monocytes Relative: 9 % (ref 3–12)
Monocytes Relative: 7 % (ref 3–12)
Monocytes Relative: 8 % (ref 3–12)
Neutrophils Relative %: 71 % (ref 43–77)
Neutrophils Relative %: 59 % (ref 43–77)
Neutrophils Relative %: 69 % (ref 43–77)

## 2011-02-18 LAB — COMPREHENSIVE METABOLIC PANEL
ALT: 16 U/L (ref 0–53)
ALT: 21 U/L (ref 0–53)
ALT: 10 U/L (ref 0–53)
ALT: 13 U/L (ref 0–53)
AST: 17 U/L (ref 0–37)
AST: 19 U/L (ref 0–37)
Albumin: 3.6 g/dL (ref 3.5–5.2)
Albumin: 4.2 g/dL (ref 3.5–5.2)
Alkaline Phosphatase: 107 U/L (ref 39–117)
Alkaline Phosphatase: 114 U/L (ref 39–117)
BUN: 7 mg/dL (ref 6–23)
CO2: 25 mEq/L (ref 19–32)
CO2: 23 mEq/L (ref 19–32)
CO2: 28 mEq/L (ref 19–32)
Calcium: 8.8 mg/dL (ref 8.4–10.5)
Calcium: 9.5 mg/dL (ref 8.4–10.5)
Calcium: 9.3 mg/dL (ref 8.4–10.5)
Chloride: 105 mEq/L (ref 96–112)
Chloride: 106 mEq/L (ref 96–112)
Creatinine, Ser: 1.11 mg/dL (ref 0.4–1.5)
Creatinine, Ser: 1.26 mg/dL (ref 0.4–1.5)
GFR calc Af Amer: >60 mL/min (ref >60)
GFR calc Af Amer: >60 mL/min (ref >60)
GFR calc Af Amer: 57 mL/min — ABNORMAL LOW (ref >60)
GFR calc non Af Amer: >60 mL/min (ref >60)
GFR calc non Af Amer: 47 mL/min — ABNORMAL LOW (ref >60)
GFR calc non Af Amer: >60 mL/min (ref >60)
Glucose, Bld: 113 mg/dL — ABNORMAL HIGH (ref 70–99)
Glucose, Bld: 98 mg/dL (ref 70–99)
Potassium: 4.8 mEq/L (ref 3.5–5.1)
Sodium: 138 mEq/L (ref 135–145)
Sodium: 140 mEq/L (ref 135–145)
Sodium: 132 mEq/L — ABNORMAL LOW (ref 135–145)
Sodium: 135 mEq/L (ref 135–145)
Total Bilirubin: 1.2 mg/dL (ref 0.3–1.2)
Total Bilirubin: 1.4 mg/dL — ABNORMAL HIGH (ref 0.3–1.2)
Total Protein: 7.6 g/dL (ref 6.0–8.3)
Total Protein: 8.8 g/dL — ABNORMAL HIGH (ref 6.0–8.3)

## 2011-02-18 LAB — URINE MICROSCOPIC-ADD ON

## 2011-02-18 LAB — BASIC METABOLIC PANEL
BUN: 5 mg/dL — ABNORMAL LOW (ref 6–23)
BUN: 10 mg/dL (ref 6–23)
BUN: 16 mg/dL (ref 6–23)
BUN: 22 mg/dL (ref 6–23)
CO2: 22 mEq/L (ref 19–32)
CO2: 24 mEq/L (ref 19–32)
Calcium: 9.2 mg/dL (ref 8.4–10.5)
Calcium: 8.4 mg/dL (ref 8.4–10.5)
Calcium: 8.6 mg/dL (ref 8.4–10.5)
Calcium: 9.2 mg/dL (ref 8.4–10.5)
Chloride: 113 mEq/L — ABNORMAL HIGH (ref 96–112)
Creatinine, Ser: 1.03 mg/dL (ref 0.4–1.5)
Creatinine, Ser: 1.47 mg/dL (ref 0.4–1.5)
Creatinine, Ser: 1.56 mg/dL — ABNORMAL HIGH (ref 0.4–1.5)
GFR calc Af Amer: >60 mL/min (ref >60)
GFR calc Af Amer: >60 mL/min (ref >60)
GFR calc non Af Amer: >60 mL/min (ref >60)
GFR calc non Af Amer: >60 mL/min (ref >60)
Glucose, Bld: 90 mg/dL (ref 70–99)
Glucose, Bld: 124 mg/dL — ABNORMAL HIGH (ref 70–99)
Glucose, Bld: 76 mg/dL (ref 70–99)
Potassium: 3.5 mEq/L (ref 3.5–5.1)
Potassium: 3.6 mEq/L (ref 3.5–5.1)
Sodium: 136 mEq/L (ref 135–145)

## 2011-02-18 LAB — ETHANOL: Alcohol, Ethyl (B): <5 mg/dL (ref 0–10)

## 2011-02-18 LAB — PHOSPHORUS: Phosphorus: 5 mg/dL — ABNORMAL HIGH (ref 2.3–4.6)

## 2011-02-18 LAB — URINE CULTURE: Colony Count: 75000

## 2011-02-18 LAB — MAGNESIUM: Magnesium: 2 mg/dL (ref 1.5–2.5)

## 2011-02-19 LAB — POCT I-STAT, CHEM 8
BUN: 8 mg/dL (ref 6–23)
Chloride: 103 mEq/L (ref 96–112)
Creatinine, Ser: 1.4 mg/dL (ref 0.4–1.5)
Potassium: 3.7 mEq/L (ref 3.5–5.1)
Sodium: 138 mEq/L (ref 135–145)
TCO2: 27 mmol/L (ref 0–100)

## 2011-02-19 LAB — COMPREHENSIVE METABOLIC PANEL
ALT: 14 U/L (ref 0–53)
AST: 18 U/L (ref 0–37)
AST: 15 U/L (ref 0–37)
Albumin: 2.9 g/dL — ABNORMAL LOW (ref 3.5–5.2)
Alkaline Phosphatase: 91 U/L (ref 39–117)
BUN: 2 mg/dL — ABNORMAL LOW (ref 6–23)
BUN: 6 mg/dL (ref 6–23)
BUN: 19 mg/dL (ref 6–23)
BUN: 27 mg/dL — ABNORMAL HIGH (ref 6–23)
CO2: 18 mEq/L — ABNORMAL LOW (ref 19–32)
CO2: 19 mEq/L (ref 19–32)
Calcium: 8.7 mg/dL (ref 8.4–10.5)
Calcium: 9.9 mg/dL (ref 8.4–10.5)
Chloride: 107 mEq/L (ref 96–112)
Chloride: 110 mEq/L (ref 96–112)
Creatinine, Ser: 1 mg/dL (ref 0.4–1.5)
Creatinine, Ser: 1.98 mg/dL — ABNORMAL HIGH (ref 0.4–1.5)
GFR calc Af Amer: >60 mL/min (ref >60)
GFR calc Af Amer: 47 mL/min — ABNORMAL LOW (ref >60)
GFR calc non Af Amer: >60 mL/min (ref >60)
GFR calc non Af Amer: 38 mL/min — ABNORMAL LOW (ref >60)
Glucose, Bld: 103 mg/dL — ABNORMAL HIGH (ref 70–99)
Glucose, Bld: 125 mg/dL — ABNORMAL HIGH (ref 70–99)
Glucose, Bld: 100 mg/dL — ABNORMAL HIGH (ref 70–99)
Potassium: 3.5 mEq/L (ref 3.5–5.1)
Sodium: 135 mEq/L (ref 135–145)
Total Bilirubin: 1.3 mg/dL — ABNORMAL HIGH (ref 0.3–1.2)
Total Bilirubin: 1.6 mg/dL — ABNORMAL HIGH (ref 0.3–1.2)
Total Bilirubin: 1.3 mg/dL — ABNORMAL HIGH (ref 0.3–1.2)
Total Protein: 5.8 g/dL — ABNORMAL LOW (ref 6.0–8.3)
Total Protein: 9.4 g/dL — ABNORMAL HIGH (ref 6.0–8.3)

## 2011-02-19 LAB — CBC
HCT: 30.4 % — ABNORMAL LOW (ref 39.0–52.0)
HCT: 33.3 % — ABNORMAL LOW (ref 39.0–52.0)
HCT: 33.7 % — ABNORMAL LOW (ref 39.0–52.0)
HCT: 38.9 % — ABNORMAL LOW (ref 39.0–52.0)
HCT: 36.5 % — ABNORMAL LOW (ref 39.0–52.0)
HCT: 39.5 % (ref 39.0–52.0)
Hemoglobin: 10 g/dL — ABNORMAL LOW (ref 13.0–17.0)
Hemoglobin: 10.7 g/dL — ABNORMAL LOW (ref 13.0–17.0)
Hemoglobin: 12.7 g/dL — ABNORMAL LOW (ref 13.0–17.0)
Hemoglobin: 11.9 g/dL — ABNORMAL LOW (ref 13.0–17.0)
Hemoglobin: 13.1 g/dL (ref 13.0–17.0)
MCHC: 32.7 g/dL (ref 30.0–36.0)
MCHC: 33.8 g/dL (ref 30.0–36.0)
MCHC: 33.2 g/dL (ref 30.0–36.0)
MCV: 78.8 fL (ref 78.0–100.0)
MCV: 80.9 fL (ref 78.0–100.0)
MCV: 80.5 fL (ref 78.0–100.0)
Platelets: 426 10*3/uL — ABNORMAL HIGH (ref 150–400)
Platelets: 425 10*3/uL — ABNORMAL HIGH (ref 150–400)
RBC: 4.28 MIL/uL (ref 4.22–5.81)
RBC: 4.81 MIL/uL (ref 4.22–5.81)
RBC: 4.54 MIL/uL (ref 4.22–5.81)
RDW: 19.2 % — ABNORMAL HIGH (ref 11.5–15.5)
WBC: 12.7 10*3/uL — ABNORMAL HIGH (ref 4.0–10.5)
WBC: 6.7 10*3/uL (ref 4.0–10.5)
WBC: 8.5 10*3/uL (ref 4.0–10.5)

## 2011-02-19 LAB — URINALYSIS, ROUTINE W REFLEX MICROSCOPIC
Glucose, UA: NEGATIVE mg/dL
Hgb urine dipstick: NEGATIVE
Ketones, ur: 15 mg/dL — AB
Ketones, ur: 15 mg/dL — AB
Leukocytes, UA: NEGATIVE
Protein, ur: 30 mg/dL — AB
Protein, ur: 100 mg/dL — AB
Urobilinogen, UA: 0.2 mg/dL (ref 0.0–1.0)
pH: 5.5 (ref 5.0–8.0)

## 2011-02-19 LAB — BASIC METABOLIC PANEL
BUN: <1 mg/dL — ABNORMAL LOW (ref 6–23)
CO2: 25 mEq/L (ref 19–32)
CO2: 29 mEq/L (ref 19–32)
Calcium: 9.3 mg/dL (ref 8.4–10.5)
Chloride: 104 mEq/L (ref 96–112)
Chloride: 106 mEq/L (ref 96–112)
GFR calc Af Amer: >60 mL/min (ref >60)
GFR calc Af Amer: >60 mL/min (ref >60)
GFR calc non Af Amer: >60 mL/min (ref >60)
Glucose, Bld: 91 mg/dL (ref 70–99)
Glucose, Bld: 92 mg/dL (ref 70–99)
Potassium: 3.2 mEq/L — ABNORMAL LOW (ref 3.5–5.1)
Potassium: 3.4 mEq/L — ABNORMAL LOW (ref 3.5–5.1)
Potassium: 3.4 mEq/L — ABNORMAL LOW (ref 3.5–5.1)
Sodium: 135 mEq/L (ref 135–145)
Sodium: 137 mEq/L (ref 135–145)
Sodium: 140 mEq/L (ref 135–145)

## 2011-02-19 LAB — URINE CULTURE: Colony Count: 30000

## 2011-02-19 LAB — WOUND CULTURE

## 2011-02-19 LAB — LIPASE, BLOOD: Lipase: 26 U/L (ref 11–59)

## 2011-02-19 LAB — DIFFERENTIAL
Basophils Absolute: 0 10*3/uL (ref 0.0–0.1)
Basophils Relative: 1 % (ref 0–1)
Basophils Relative: 1 % (ref 0–1)
Eosinophils Absolute: 0.4 10*3/uL (ref 0.0–0.7)
Eosinophils Relative: 3 % (ref 0–5)
Eosinophils Relative: 5 % (ref 0–5)
Lymphocytes Relative: 13 % (ref 12–46)
Lymphocytes Relative: 22 % (ref 12–46)
Lymphocytes Relative: 15 % (ref 12–46)
Lymphs Abs: 1.6 10*3/uL (ref 0.7–4.0)
Lymphs Abs: 1.6 10*3/uL (ref 0.7–4.0)
Lymphs Abs: 1.9 10*3/uL (ref 0.7–4.0)
Monocytes Absolute: 0.9 10*3/uL (ref 0.1–1.0)
Monocytes Absolute: 0.5 10*3/uL (ref 0.1–1.0)
Monocytes Relative: 8 % (ref 3–12)
Monocytes Relative: 6 % (ref 3–12)
Monocytes Relative: 6 % (ref 3–12)
Neutro Abs: 10.3 10*3/uL — ABNORMAL HIGH (ref 1.7–7.7)
Neutro Abs: 7.3 10*3/uL (ref 1.7–7.7)
Neutro Abs: 9.4 10*3/uL — ABNORMAL HIGH (ref 1.7–7.7)
Neutrophils Relative %: 83 % — ABNORMAL HIGH (ref 43–77)
Neutrophils Relative %: 69 % (ref 43–77)
Neutrophils Relative %: 76 % (ref 43–77)

## 2011-02-19 LAB — URINE MICROSCOPIC-ADD ON

## 2011-02-19 LAB — PHOSPHORUS: Phosphorus: 2.4 mg/dL (ref 2.3–4.6)

## 2011-02-20 LAB — URINALYSIS, ROUTINE W REFLEX MICROSCOPIC
Glucose, UA: NEGATIVE mg/dL
Protein, ur: 30 mg/dL — AB
Specific Gravity, Urine: 1.031 — ABNORMAL HIGH (ref 1.005–1.030)
Urobilinogen, UA: 1 mg/dL (ref 0.0–1.0)

## 2011-02-20 LAB — CBC
HCT: 23.6 % — ABNORMAL LOW (ref 39.0–52.0)
HCT: 24.2 % — ABNORMAL LOW (ref 39.0–52.0)
HCT: 25.4 % — ABNORMAL LOW (ref 39.0–52.0)
HCT: 25.8 % — ABNORMAL LOW (ref 39.0–52.0)
HCT: 26.4 % — ABNORMAL LOW (ref 39.0–52.0)
HCT: 26.8 % — ABNORMAL LOW (ref 39.0–52.0)
HCT: 38.3 % — ABNORMAL LOW (ref 39.0–52.0)
HCT: 38.7 % — ABNORMAL LOW (ref 39.0–52.0)
Hemoglobin: 12.3 g/dL — ABNORMAL LOW (ref 13.0–17.0)
Hemoglobin: 12.1 g/dL — ABNORMAL LOW (ref 13.0–17.0)
Hemoglobin: 12.5 g/dL — ABNORMAL LOW (ref 13.0–17.0)
Hemoglobin: 12.5 g/dL — ABNORMAL LOW (ref 13.0–17.0)
Hemoglobin: 13.2 g/dL (ref 13.0–17.0)
Hemoglobin: 7.7 g/dL — ABNORMAL LOW (ref 13.0–17.0)
Hemoglobin: 7.8 g/dL — ABNORMAL LOW (ref 13.0–17.0)
Hemoglobin: 8 g/dL — ABNORMAL LOW (ref 13.0–17.0)
Hemoglobin: 8 g/dL — ABNORMAL LOW (ref 13.0–17.0)
Hemoglobin: 8.3 g/dL — ABNORMAL LOW (ref 13.0–17.0)
Hemoglobin: 8.5 g/dL — ABNORMAL LOW (ref 13.0–17.0)
Hemoglobin: 8.6 g/dL — ABNORMAL LOW (ref 13.0–17.0)
Hemoglobin: 8.7 g/dL — ABNORMAL LOW (ref 13.0–17.0)
Hemoglobin: 8.8 g/dL — ABNORMAL LOW (ref 13.0–17.0)
MCHC: 32.5 g/dL (ref 30.0–36.0)
MCHC: 32.3 g/dL (ref 30.0–36.0)
MCHC: 33.2 g/dL (ref 30.0–36.0)
MCHC: 33.5 g/dL (ref 30.0–36.0)
MCHC: 33.6 g/dL (ref 30.0–36.0)
MCHC: 33.7 g/dL (ref 30.0–36.0)
MCHC: 33.7 g/dL (ref 30.0–36.0)
MCHC: 33.8 g/dL (ref 30.0–36.0)
MCHC: 33.9 g/dL (ref 30.0–36.0)
MCHC: 34 g/dL (ref 30.0–36.0)
MCHC: 34 g/dL (ref 30.0–36.0)
MCHC: 34.4 g/dL (ref 30.0–36.0)
MCHC: 34.7 g/dL (ref 30.0–36.0)
MCV: 80.2 fL (ref 78.0–100.0)
MCV: 89.6 fL (ref 78.0–100.0)
MCV: 89.8 fL (ref 78.0–100.0)
MCV: 89.9 fL (ref 78.0–100.0)
MCV: 89.9 fL (ref 78.0–100.0)
MCV: 90.1 fL (ref 78.0–100.0)
MCV: 90.3 fL (ref 78.0–100.0)
MCV: 90.8 fL (ref 78.0–100.0)
MCV: 92.3 fL (ref 78.0–100.0)
MCV: 92.7 fL (ref 78.0–100.0)
Platelets: 488 10*3/uL — ABNORMAL HIGH (ref 150–400)
Platelets: 536 10*3/uL — ABNORMAL HIGH (ref 150–400)
Platelets: 262 10*3/uL (ref 150–400)
Platelets: 286 10*3/uL (ref 150–400)
Platelets: 298 10*3/uL (ref 150–400)
Platelets: 329 10*3/uL (ref 150–400)
Platelets: 333 10*3/uL (ref 150–400)
Platelets: 361 10*3/uL (ref 150–400)
Platelets: 433 10*3/uL — ABNORMAL HIGH (ref 150–400)
Platelets: 550 10*3/uL — ABNORMAL HIGH (ref 150–400)
Platelets: 557 10*3/uL — ABNORMAL HIGH (ref 150–400)
Platelets: 726 10*3/uL — ABNORMAL HIGH (ref 150–400)
Platelets: 747 10*3/uL — ABNORMAL HIGH (ref 150–400)
RBC: 4.72 MIL/uL (ref 4.22–5.81)
RBC: 2.49 MIL/uL — ABNORMAL LOW (ref 4.22–5.81)
RBC: 2.61 MIL/uL — ABNORMAL LOW (ref 4.22–5.81)
RBC: 2.63 MIL/uL — ABNORMAL LOW (ref 4.22–5.81)
RBC: 2.63 MIL/uL — ABNORMAL LOW (ref 4.22–5.81)
RBC: 2.75 MIL/uL — ABNORMAL LOW (ref 4.22–5.81)
RBC: 2.8 MIL/uL — ABNORMAL LOW (ref 4.22–5.81)
RBC: 2.8 MIL/uL — ABNORMAL LOW (ref 4.22–5.81)
RBC: 2.9 MIL/uL — ABNORMAL LOW (ref 4.22–5.81)
RBC: 2.98 MIL/uL — ABNORMAL LOW (ref 4.22–5.81)
RBC: 2.98 MIL/uL — ABNORMAL LOW (ref 4.22–5.81)
RBC: 3.94 MIL/uL — ABNORMAL LOW (ref 4.22–5.81)
RBC: 4.29 MIL/uL (ref 4.22–5.81)
RBC: 4.34 MIL/uL (ref 4.22–5.81)
RBC: 4.36 MIL/uL (ref 4.22–5.81)
RDW: 17.2 % — ABNORMAL HIGH (ref 11.5–15.5)
RDW: 17.6 % — ABNORMAL HIGH (ref 11.5–15.5)
RDW: 17.7 % — ABNORMAL HIGH (ref 11.5–15.5)
RDW: 14.6 % (ref 11.5–15.5)
RDW: 14.8 % (ref 11.5–15.5)
RDW: 15.2 % (ref 11.5–15.5)
RDW: 15.2 % (ref 11.5–15.5)
RDW: 15.3 % (ref 11.5–15.5)
RDW: 15.3 % (ref 11.5–15.5)
RDW: 15.3 % (ref 11.5–15.5)
RDW: 15.5 % (ref 11.5–15.5)
RDW: 15.5 % (ref 11.5–15.5)
RDW: 15.6 % — ABNORMAL HIGH (ref 11.5–15.5)
RDW: 15.6 % — ABNORMAL HIGH (ref 11.5–15.5)
RDW: 15.7 % — ABNORMAL HIGH (ref 11.5–15.5)
RDW: 15.9 % — ABNORMAL HIGH (ref 11.5–15.5)
WBC: 6 10*3/uL (ref 4.0–10.5)
WBC: 11.5 10*3/uL — ABNORMAL HIGH (ref 4.0–10.5)
WBC: 11.6 10*3/uL — ABNORMAL HIGH (ref 4.0–10.5)
WBC: 14.3 10*3/uL — ABNORMAL HIGH (ref 4.0–10.5)
WBC: 14.3 10*3/uL — ABNORMAL HIGH (ref 4.0–10.5)
WBC: 15.7 10*3/uL — ABNORMAL HIGH (ref 4.0–10.5)
WBC: 16.6 10*3/uL — ABNORMAL HIGH (ref 4.0–10.5)
WBC: 18.2 10*3/uL — ABNORMAL HIGH (ref 4.0–10.5)
WBC: 18.7 10*3/uL — ABNORMAL HIGH (ref 4.0–10.5)
WBC: 19 10*3/uL — ABNORMAL HIGH (ref 4.0–10.5)
WBC: 19.7 10*3/uL — ABNORMAL HIGH (ref 4.0–10.5)
WBC: 6.6 10*3/uL (ref 4.0–10.5)
WBC: 8.6 10*3/uL (ref 4.0–10.5)

## 2011-02-20 LAB — MAGNESIUM
Magnesium: 2 mg/dL (ref 1.5–2.5)
Magnesium: 2.1 mg/dL (ref 1.5–2.5)

## 2011-02-20 LAB — CROSSMATCH: Antibody Screen: NEGATIVE

## 2011-02-20 LAB — DIFFERENTIAL
Basophils Absolute: 0.1 10*3/uL (ref 0.0–0.1)
Basophils Absolute: 0.1 10*3/uL (ref 0.0–0.1)
Basophils Absolute: 0 10*3/uL (ref 0.0–0.1)
Basophils Absolute: 0 10*3/uL (ref 0.0–0.1)
Basophils Relative: 1 % (ref 0–1)
Basophils Relative: 1 % (ref 0–1)
Basophils Relative: 0 % (ref 0–1)
Eosinophils Absolute: 0.2 10*3/uL (ref 0.0–0.7)
Eosinophils Absolute: 0.3 10*3/uL (ref 0.0–0.7)
Eosinophils Absolute: 0.4 10*3/uL (ref 0.0–0.7)
Eosinophils Absolute: 0.4 10*3/uL (ref 0.0–0.7)
Eosinophils Relative: 5 % (ref 0–5)
Eosinophils Relative: 0 % (ref 0–5)
Eosinophils Relative: 4 % (ref 0–5)
Lymphocytes Relative: 23 % (ref 12–46)
Lymphocytes Relative: 8 % — ABNORMAL LOW (ref 12–46)
Lymphs Abs: 1.3 10*3/uL (ref 0.7–4.0)
Monocytes Absolute: 0.7 10*3/uL (ref 0.1–1.0)
Monocytes Absolute: 0.9 10*3/uL (ref 0.1–1.0)
Monocytes Absolute: 0.4 10*3/uL (ref 0.1–1.0)
Monocytes Relative: 8 % (ref 3–12)
Monocytes Relative: 8 % (ref 3–12)
Monocytes Relative: 8 % (ref 3–12)
Monocytes Relative: 9 % (ref 3–12)
Neutro Abs: 3.6 10*3/uL (ref 1.7–7.7)
Neutro Abs: 15.6 10*3/uL — ABNORMAL HIGH (ref 1.7–7.7)
Neutro Abs: 17.6 10*3/uL — ABNORMAL HIGH (ref 1.7–7.7)
Neutrophils Relative %: 61 % (ref 43–77)
Neutrophils Relative %: 82 % — ABNORMAL HIGH (ref 43–77)

## 2011-02-20 LAB — COMPREHENSIVE METABOLIC PANEL
ALT: 163 U/L — ABNORMAL HIGH (ref 0–53)
ALT: 8 U/L (ref 0–53)
ALT: 8 U/L (ref 0–53)
ALT: <8 U/L (ref 0–53)
AST: 11 U/L (ref 0–37)
AST: 12 U/L (ref 0–37)
AST: 12 U/L (ref 0–37)
AST: 13 U/L (ref 0–37)
AST: 18 U/L (ref 0–37)
Albumin: 3.4 g/dL — ABNORMAL LOW (ref 3.5–5.2)
Albumin: 2 g/dL — ABNORMAL LOW (ref 3.5–5.2)
Albumin: 2 g/dL — ABNORMAL LOW (ref 3.5–5.2)
Albumin: 2.1 g/dL — ABNORMAL LOW (ref 3.5–5.2)
Alkaline Phosphatase: 183 U/L — ABNORMAL HIGH (ref 39–117)
Alkaline Phosphatase: 116 U/L (ref 39–117)
Alkaline Phosphatase: 43 U/L (ref 39–117)
Alkaline Phosphatase: 94 U/L (ref 39–117)
BUN: 11 mg/dL (ref 6–23)
BUN: 13 mg/dL (ref 6–23)
BUN: 5 mg/dL — ABNORMAL LOW (ref 6–23)
CO2: 24 mEq/L (ref 19–32)
CO2: 26 mEq/L (ref 19–32)
CO2: 26 mEq/L (ref 19–32)
CO2: 27 mEq/L (ref 19–32)
CO2: 27 mEq/L (ref 19–32)
Calcium: 7.4 mg/dL — ABNORMAL LOW (ref 8.4–10.5)
Calcium: 8.1 mg/dL — ABNORMAL LOW (ref 8.4–10.5)
Chloride: 101 mEq/L (ref 96–112)
Chloride: 101 mEq/L (ref 96–112)
Chloride: 101 mEq/L (ref 96–112)
Chloride: 101 mEq/L (ref 96–112)
Chloride: 102 mEq/L (ref 96–112)
Creatinine, Ser: 0.84 mg/dL (ref 0.4–1.5)
Creatinine, Ser: 0.87 mg/dL (ref 0.4–1.5)
Creatinine, Ser: 0.9 mg/dL (ref 0.4–1.5)
GFR calc Af Amer: >60 mL/min (ref >60)
GFR calc Af Amer: >60 mL/min (ref >60)
GFR calc Af Amer: >60 mL/min (ref >60)
GFR calc Af Amer: >60 mL/min (ref >60)
GFR calc non Af Amer: >60 mL/min (ref >60)
GFR calc non Af Amer: >60 mL/min (ref >60)
GFR calc non Af Amer: >60 mL/min (ref >60)
Glucose, Bld: 108 mg/dL — ABNORMAL HIGH (ref 70–99)
Potassium: 4.4 mEq/L (ref 3.5–5.1)
Potassium: 3.3 mEq/L — ABNORMAL LOW (ref 3.5–5.1)
Potassium: 3.3 mEq/L — ABNORMAL LOW (ref 3.5–5.1)
Potassium: 4.3 mEq/L (ref 3.5–5.1)
Sodium: 137 mEq/L (ref 135–145)
Sodium: 133 mEq/L — ABNORMAL LOW (ref 135–145)
Sodium: 134 mEq/L — ABNORMAL LOW (ref 135–145)
Sodium: 135 mEq/L (ref 135–145)
Total Bilirubin: 0.9 mg/dL (ref 0.3–1.2)
Total Bilirubin: 1.3 mg/dL — ABNORMAL HIGH (ref 0.3–1.2)
Total Bilirubin: 1.4 mg/dL — ABNORMAL HIGH (ref 0.3–1.2)
Total Bilirubin: 1.9 mg/dL — ABNORMAL HIGH (ref 0.3–1.2)
Total Bilirubin: 2.3 mg/dL — ABNORMAL HIGH (ref 0.3–1.2)
Total Protein: 7.9 g/dL (ref 6.0–8.3)
Total Protein: 5.2 g/dL — ABNORMAL LOW (ref 6.0–8.3)
Total Protein: 6.4 g/dL (ref 6.0–8.3)

## 2011-02-20 LAB — BASIC METABOLIC PANEL
BUN: 2 mg/dL — ABNORMAL LOW (ref 6–23)
BUN: 2 mg/dL — ABNORMAL LOW (ref 6–23)
BUN: 21 mg/dL (ref 6–23)
BUN: 2 mg/dL — ABNORMAL LOW (ref 6–23)
BUN: 3 mg/dL — ABNORMAL LOW (ref 6–23)
BUN: 8 mg/dL (ref 6–23)
CO2: 19 mEq/L (ref 19–32)
CO2: 25 mEq/L (ref 19–32)
CO2: 26 mEq/L (ref 19–32)
CO2: 27 mEq/L (ref 19–32)
CO2: 28 mEq/L (ref 19–32)
Calcium: 10.5 mg/dL (ref 8.4–10.5)
Calcium: 8.2 mg/dL — ABNORMAL LOW (ref 8.4–10.5)
Calcium: 8.5 mg/dL (ref 8.4–10.5)
Calcium: 8.9 mg/dL (ref 8.4–10.5)
Calcium: 7.2 mg/dL — ABNORMAL LOW (ref 8.4–10.5)
Calcium: 7.4 mg/dL — ABNORMAL LOW (ref 8.4–10.5)
Calcium: 7.8 mg/dL — ABNORMAL LOW (ref 8.4–10.5)
Calcium: 7.8 mg/dL — ABNORMAL LOW (ref 8.4–10.5)
Calcium: 7.9 mg/dL — ABNORMAL LOW (ref 8.4–10.5)
Calcium: 8.4 mg/dL (ref 8.4–10.5)
Calcium: 8.5 mg/dL (ref 8.4–10.5)
Chloride: 101 mEq/L (ref 96–112)
Chloride: 108 mEq/L (ref 96–112)
Chloride: 100 mEq/L (ref 96–112)
Chloride: 101 mEq/L (ref 96–112)
Creatinine, Ser: 0.71 mg/dL (ref 0.4–1.5)
Creatinine, Ser: 0.88 mg/dL (ref 0.4–1.5)
Creatinine, Ser: 1.21 mg/dL (ref 0.4–1.5)
Creatinine, Ser: 2.14 mg/dL — ABNORMAL HIGH (ref 0.4–1.5)
Creatinine, Ser: 0.74 mg/dL (ref 0.4–1.5)
Creatinine, Ser: 0.77 mg/dL (ref 0.4–1.5)
Creatinine, Ser: 0.95 mg/dL (ref 0.4–1.5)
Creatinine, Ser: 1.02 mg/dL (ref 0.4–1.5)
Creatinine, Ser: 1.07 mg/dL (ref 0.4–1.5)
GFR calc Af Amer: 43 mL/min — ABNORMAL LOW (ref >60)
GFR calc Af Amer: >60 mL/min (ref >60)
GFR calc Af Amer: >60 mL/min (ref >60)
GFR calc Af Amer: >60 mL/min (ref >60)
GFR calc Af Amer: >60 mL/min (ref >60)
GFR calc Af Amer: >60 mL/min (ref >60)
GFR calc Af Amer: >60 mL/min (ref >60)
GFR calc Af Amer: >60 mL/min (ref >60)
GFR calc non Af Amer: >60 mL/min (ref >60)
GFR calc non Af Amer: >60 mL/min (ref >60)
GFR calc non Af Amer: >60 mL/min (ref >60)
GFR calc non Af Amer: >60 mL/min (ref >60)
GFR calc non Af Amer: >60 mL/min (ref >60)
GFR calc non Af Amer: >60 mL/min (ref >60)
GFR calc non Af Amer: >60 mL/min (ref >60)
GFR calc non Af Amer: >60 mL/min (ref >60)
GFR calc non Af Amer: >60 mL/min (ref >60)
Glucose, Bld: 107 mg/dL — ABNORMAL HIGH (ref 70–99)
Glucose, Bld: 85 mg/dL (ref 70–99)
Glucose, Bld: 103 mg/dL — ABNORMAL HIGH (ref 70–99)
Glucose, Bld: 241 mg/dL — ABNORMAL HIGH (ref 70–99)
Glucose, Bld: 97 mg/dL (ref 70–99)
Glucose, Bld: 99 mg/dL (ref 70–99)
Potassium: 3.6 mEq/L (ref 3.5–5.1)
Potassium: 4.1 mEq/L (ref 3.5–5.1)
Potassium: 4.2 mEq/L (ref 3.5–5.1)
Potassium: 3.6 mEq/L (ref 3.5–5.1)
Potassium: 3.7 mEq/L (ref 3.5–5.1)
Potassium: 3.8 mEq/L (ref 3.5–5.1)
Sodium: 133 mEq/L — ABNORMAL LOW (ref 135–145)
Sodium: 135 mEq/L (ref 135–145)
Sodium: 132 mEq/L — ABNORMAL LOW (ref 135–145)
Sodium: 133 mEq/L — ABNORMAL LOW (ref 135–145)
Sodium: 134 mEq/L — ABNORMAL LOW (ref 135–145)
Sodium: 135 mEq/L (ref 135–145)
Sodium: 135 mEq/L (ref 135–145)
Sodium: 137 mEq/L (ref 135–145)

## 2011-02-20 LAB — PHOSPHORUS
Phosphorus: 3 mg/dL (ref 2.3–4.6)
Phosphorus: 4.1 mg/dL (ref 2.3–4.6)

## 2011-02-20 LAB — PROTIME-INR
INR: 1.64 — ABNORMAL HIGH (ref 0.00–1.49)
INR: 1.81 — ABNORMAL HIGH (ref 0.00–1.49)
Prothrombin Time: 16.9 seconds — ABNORMAL HIGH (ref 11.6–15.2)

## 2011-02-20 LAB — SEDIMENTATION RATE: Sed Rate: 39 mm/hr — ABNORMAL HIGH (ref 0–16)

## 2011-02-20 LAB — GLUCOSE, CAPILLARY
Glucose-Capillary: 108 mg/dL — ABNORMAL HIGH (ref 70–99)
Glucose-Capillary: 110 mg/dL — ABNORMAL HIGH (ref 70–99)
Glucose-Capillary: 111 mg/dL — ABNORMAL HIGH (ref 70–99)
Glucose-Capillary: 88 mg/dL (ref 70–99)
Glucose-Capillary: 99 mg/dL (ref 70–99)

## 2011-02-20 LAB — PREALBUMIN: Prealbumin: 6.7 mg/dL — ABNORMAL LOW (ref 18.0–45.0)

## 2011-02-20 LAB — URINE MICROSCOPIC-ADD ON

## 2011-02-20 LAB — URINALYSIS, MICROSCOPIC ONLY
Bilirubin Urine: NEGATIVE
Glucose, UA: NEGATIVE mg/dL
Ketones, ur: NEGATIVE mg/dL
Leukocytes, UA: NEGATIVE
pH: 5 (ref 5.0–8.0)

## 2011-02-20 LAB — BLOOD GAS, ARTERIAL
Drawn by: 324701
O2 Saturation: 90.2 %
Patient temperature: 98.6
pH, Arterial: 7.445 (ref 7.350–7.450)
pO2, Arterial: 55.7 mmHg — ABNORMAL LOW (ref 80.0–100.0)

## 2011-02-20 LAB — PREPARE FRESH FROZEN PLASMA

## 2011-02-20 LAB — POCT I-STAT 4, (NA,K, GLUC, HGB,HCT)
Glucose, Bld: 127 mg/dL — ABNORMAL HIGH (ref 70–99)
Hemoglobin: 12.6 g/dL — ABNORMAL LOW (ref 13.0–17.0)
Potassium: 4.2 mEq/L (ref 3.5–5.1)

## 2011-02-20 LAB — URINE CULTURE

## 2011-02-20 LAB — PREPARE RBC (CROSSMATCH)

## 2011-02-20 LAB — MRSA PCR SCREENING: MRSA by PCR: NEGATIVE

## 2011-02-20 LAB — CULTURE, ROUTINE-ABSCESS

## 2011-02-20 LAB — HEMOGLOBIN AND HEMATOCRIT, BLOOD
HCT: 22.9 % — ABNORMAL LOW (ref 39.0–52.0)
Hemoglobin: 7.6 g/dL — ABNORMAL LOW (ref 13.0–17.0)
Hemoglobin: 9.1 g/dL — ABNORMAL LOW (ref 13.0–17.0)

## 2011-02-20 LAB — TRIGLYCERIDES: Triglycerides: 69 mg/dL (ref <150)

## 2011-02-20 LAB — ABO/RH: ABO/RH(D): A POS

## 2011-02-20 IMAGING — CT CT ABD-PELV W/ CM
2 of 3 series · 17 of 46 positions shown, 19 images · IV contrast (agent unspecified)
Comparison: 01/30/2010

CLINICAL DATA: Perforated diverticulitis with intra-abdominal fluid
collections.  The patient has had surgery and catheter drainage.

CT ABDOMEN AND PELVIS WITH CONTRAST
TECHNIQUE: Multidetector CT imaging of the abdomen and pelvis was
performed following the standard protocol during bolus
administration of intravenous contrast.
Contrast: 100 ml Xmnipaque-DOO

[Series 2: routine abdomen · axial · 0.92mm/px · z∈[-501,-56]mm · 14 of 103 slices shown, 16 images]
[im 7/103  soft-tissue]
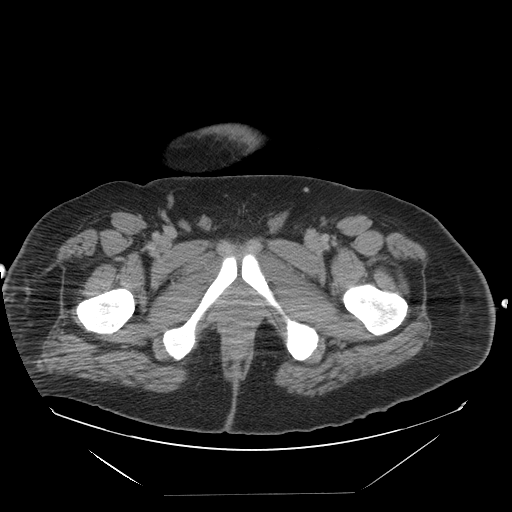
[im 7/103  bone]
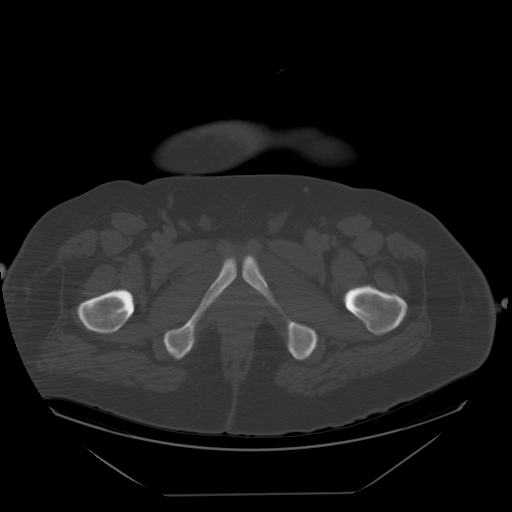
[im 14/103  soft-tissue]
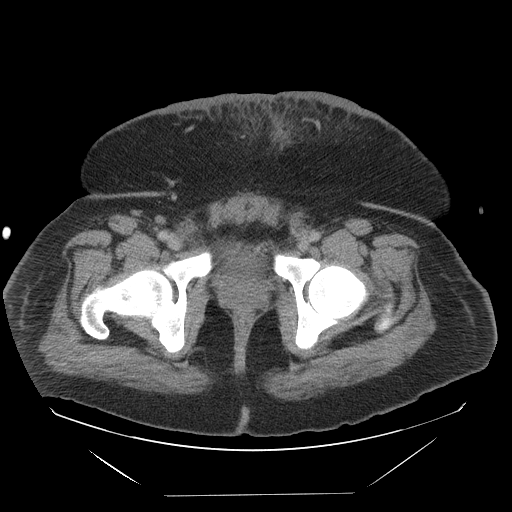
[im 20/103  soft-tissue]
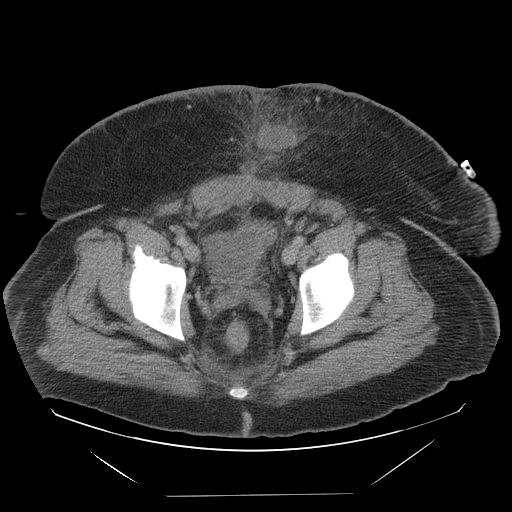
[im 27/103  soft-tissue]
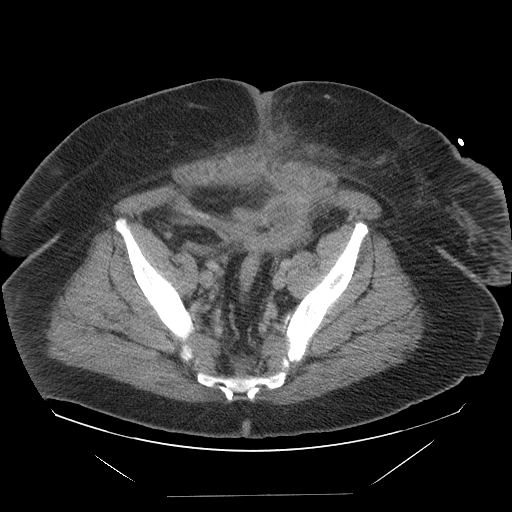
[im 33/103  soft-tissue]
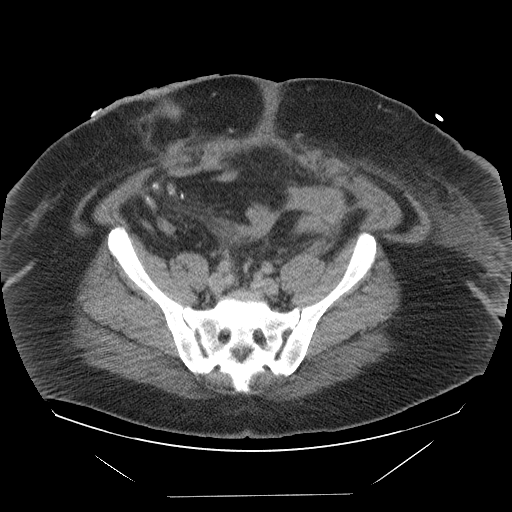
[im 40/103  soft-tissue]
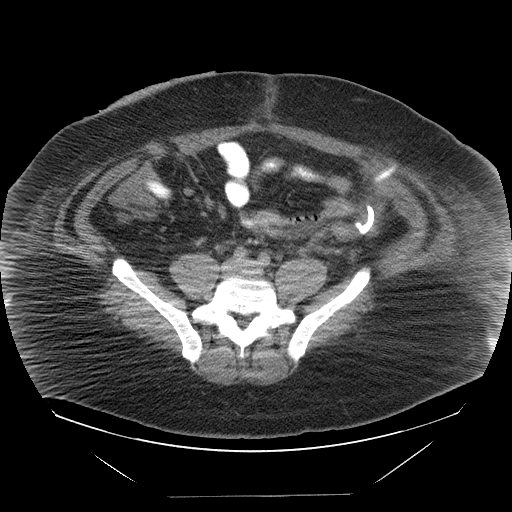
[im 47/103  soft-tissue]
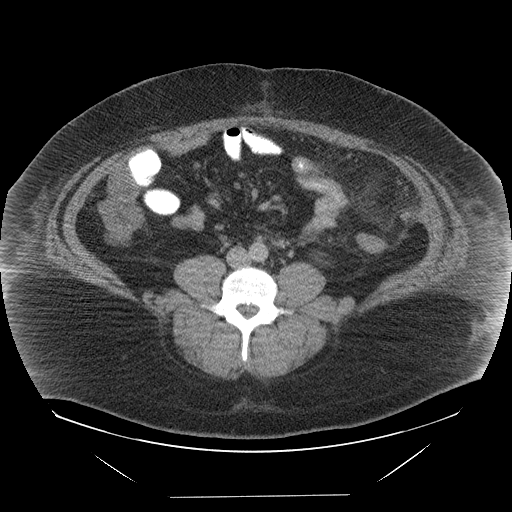
[im 56/103  soft-tissue]
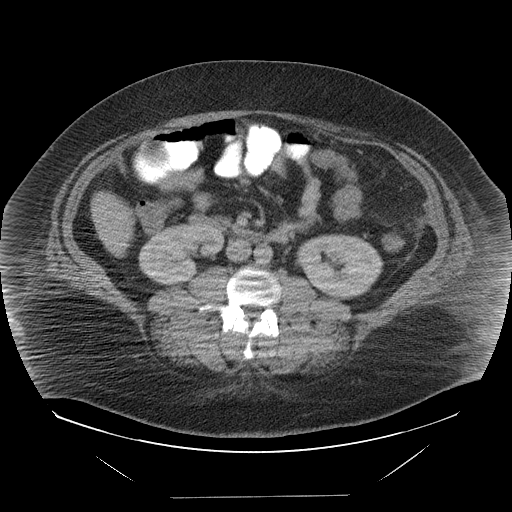
[im 63/103  soft-tissue]
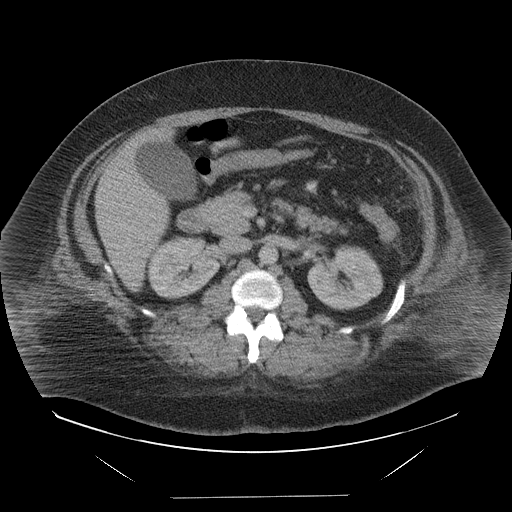
[im 63/103  bone]
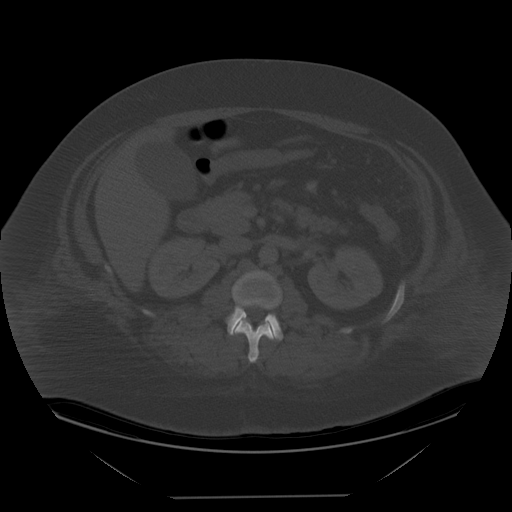
[im 70/103  soft-tissue]
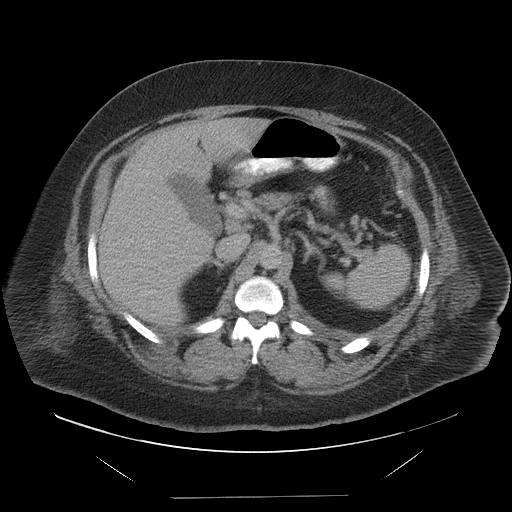
[im 76/103  soft-tissue]
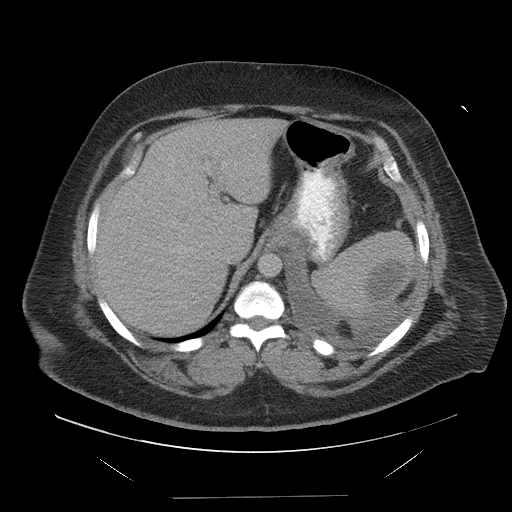
[im 83/103  soft-tissue]
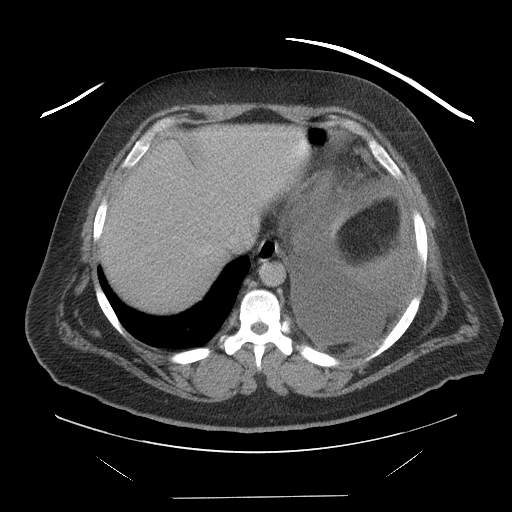
[im 89/103  soft-tissue]
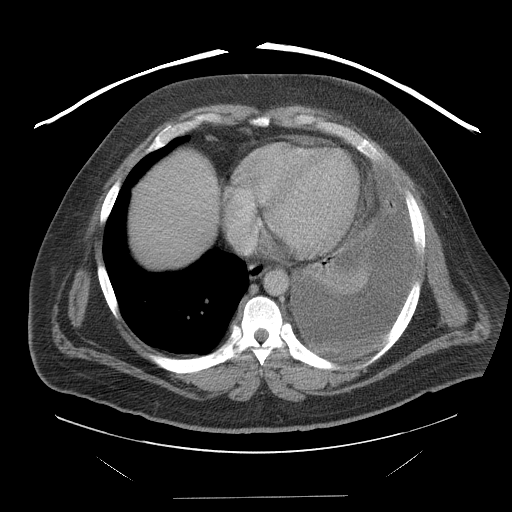
[im 96/103  soft-tissue]
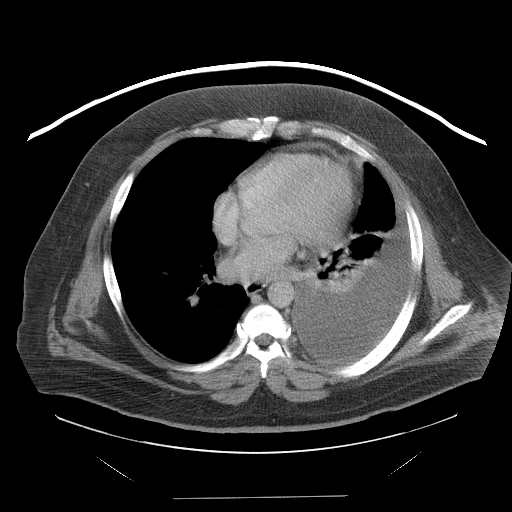

[Series 401: coronal · coronal · 0.90mm/px · 3 of 129 slices shown]
[im 43/129  soft-tissue]
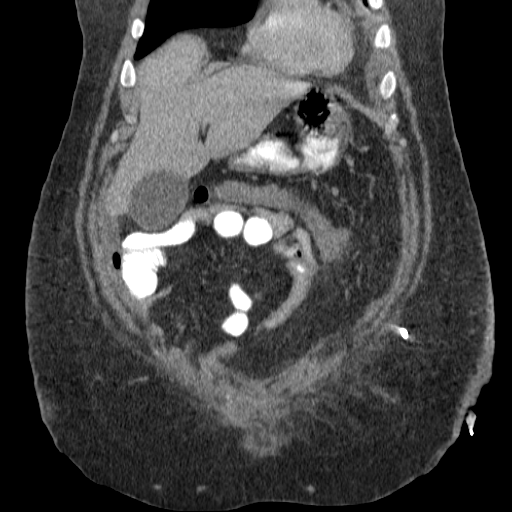
[im 57/129  soft-tissue]
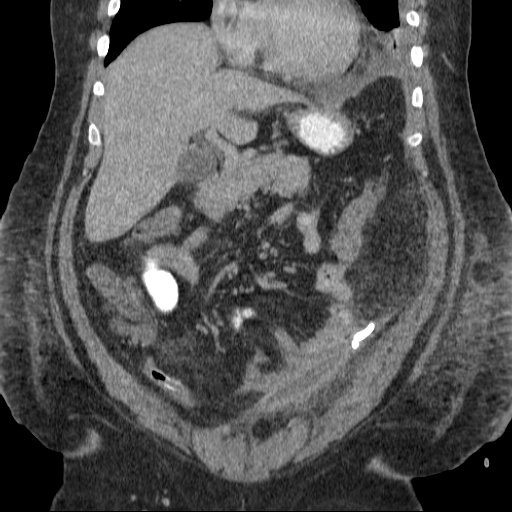
[im 72/129  soft-tissue]
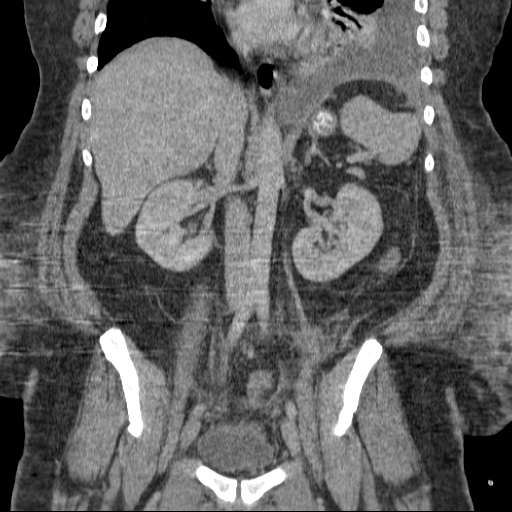

[17 of 46 positions shown; findings below may reference images not displayed]

FINDINGS: Persistent left pleural effusion with left lower lobe
atelectasis.  No right effusion.  There is still a small
pericardial effusion.

The left subphrenic collection causing some mass effect on the
spleen, is definitely smaller.  It currently measures 4.7 x 3.0 cm.
Previously with 6.6 x 4.6 cm.  The fluid collection in the left
mesentery is also smaller.  It has the left lower quadrant drainage
catheter remains in place with no visible residual collections.  No
new fluid collections are noted within the abdominal cavity.
However, there is increased fluid with an air bubble in the
subcutaneous tissues of the abdominal viscera remain unremarkable.
No gallstones or biliary dilatation. lower anterior abdominal wall,
seen on images 80-86.  Measures approximately 3.5 x 2.8 cm.
decreased from 8.9 x 3.3 cm to approximately 6.2 x 2 cm.
IMPRESSION: 1.  Multiple abdominal fluid collections - all appear to be stable
or smaller.
2.  There is a small fluid collection in the lower anterior
abdominal wall that is more prominent when compared to the prior
study.

## 2011-02-24 LAB — BASIC METABOLIC PANEL
BUN: 2 mg/dL — ABNORMAL LOW (ref 6–23)
BUN: 2 mg/dL — ABNORMAL LOW (ref 6–23)
BUN: 7 mg/dL (ref 6–23)
BUN: 7 mg/dL (ref 6–23)
CO2: 23 mEq/L (ref 19–32)
CO2: 24 mEq/L (ref 19–32)
CO2: 24 mEq/L (ref 19–32)
CO2: 27 mEq/L (ref 19–32)
Calcium: 7.9 mg/dL — ABNORMAL LOW (ref 8.4–10.5)
Chloride: 102 mEq/L (ref 96–112)
Chloride: 103 mEq/L (ref 96–112)
Chloride: 104 mEq/L (ref 96–112)
Chloride: 104 mEq/L (ref 96–112)
Chloride: 108 mEq/L (ref 96–112)
Chloride: 108 mEq/L (ref 96–112)
Chloride: 110 mEq/L (ref 96–112)
Creatinine, Ser: 0.8 mg/dL (ref 0.4–1.5)
Creatinine, Ser: 0.88 mg/dL (ref 0.4–1.5)
Creatinine, Ser: 0.92 mg/dL (ref 0.4–1.5)
Creatinine, Ser: 0.92 mg/dL (ref 0.4–1.5)
GFR calc Af Amer: >60 mL/min (ref >60)
GFR calc Af Amer: >60 mL/min (ref >60)
GFR calc Af Amer: >60 mL/min (ref >60)
GFR calc Af Amer: >60 mL/min (ref >60)
GFR calc Af Amer: >60 mL/min (ref >60)
GFR calc Af Amer: >60 mL/min (ref >60)
GFR calc non Af Amer: >60 mL/min (ref >60)
GFR calc non Af Amer: >60 mL/min (ref >60)
GFR calc non Af Amer: >60 mL/min (ref >60)
GFR calc non Af Amer: >60 mL/min (ref >60)
Glucose, Bld: 101 mg/dL — ABNORMAL HIGH (ref 70–99)
Glucose, Bld: 85 mg/dL (ref 70–99)
Potassium: 3.2 mEq/L — ABNORMAL LOW (ref 3.5–5.1)
Potassium: 3.4 mEq/L — ABNORMAL LOW (ref 3.5–5.1)
Potassium: 3.5 mEq/L (ref 3.5–5.1)
Potassium: 3.8 mEq/L (ref 3.5–5.1)
Potassium: 3.9 mEq/L (ref 3.5–5.1)
Potassium: 3.9 mEq/L (ref 3.5–5.1)
Potassium: 3.9 mEq/L (ref 3.5–5.1)
Sodium: 134 mEq/L — ABNORMAL LOW (ref 135–145)
Sodium: 135 mEq/L (ref 135–145)
Sodium: 137 mEq/L (ref 135–145)
Sodium: 138 mEq/L (ref 135–145)

## 2011-02-24 LAB — CBC
HCT: 23.4 % — ABNORMAL LOW (ref 39.0–52.0)
HCT: 23.5 % — ABNORMAL LOW (ref 39.0–52.0)
HCT: 26 % — ABNORMAL LOW (ref 39.0–52.0)
HCT: 27.2 % — ABNORMAL LOW (ref 39.0–52.0)
HCT: 27.2 % — ABNORMAL LOW (ref 39.0–52.0)
HCT: 27.2 % — ABNORMAL LOW (ref 39.0–52.0)
HCT: 30.1 % — ABNORMAL LOW (ref 39.0–52.0)
HCT: 31.5 % — ABNORMAL LOW (ref 39.0–52.0)
Hemoglobin: 10.4 g/dL — ABNORMAL LOW (ref 13.0–17.0)
Hemoglobin: 8.8 g/dL — ABNORMAL LOW (ref 13.0–17.0)
MCHC: 32.5 g/dL (ref 30.0–36.0)
MCHC: 32.8 g/dL (ref 30.0–36.0)
MCHC: 33.1 g/dL (ref 30.0–36.0)
MCV: 83.1 fL (ref 78.0–100.0)
MCV: 83.8 fL (ref 78.0–100.0)
MCV: 84.2 fL (ref 78.0–100.0)
MCV: 84.8 fL (ref 78.0–100.0)
MCV: 85.7 fL (ref 78.0–100.0)
MCV: 86.8 fL (ref 78.0–100.0)
MCV: 87.3 fL (ref 78.0–100.0)
MCV: 87.7 fL (ref 78.0–100.0)
Platelets: 646 10*3/uL — ABNORMAL HIGH (ref 150–400)
Platelets: 654 10*3/uL — ABNORMAL HIGH (ref 150–400)
Platelets: 670 10*3/uL — ABNORMAL HIGH (ref 150–400)
RBC: 2.68 MIL/uL — ABNORMAL LOW (ref 4.22–5.81)
RBC: 3.01 MIL/uL — ABNORMAL LOW (ref 4.22–5.81)
RBC: 3.03 MIL/uL — ABNORMAL LOW (ref 4.22–5.81)
RBC: 3.24 MIL/uL — ABNORMAL LOW (ref 4.22–5.81)
RBC: 3.79 MIL/uL — ABNORMAL LOW (ref 4.22–5.81)
RDW: 17.2 % — ABNORMAL HIGH (ref 11.5–15.5)
RDW: 17.2 % — ABNORMAL HIGH (ref 11.5–15.5)
WBC: 10.5 10*3/uL (ref 4.0–10.5)
WBC: 10.6 10*3/uL — ABNORMAL HIGH (ref 4.0–10.5)
WBC: 10.7 10*3/uL — ABNORMAL HIGH (ref 4.0–10.5)
WBC: 8.8 10*3/uL (ref 4.0–10.5)
WBC: 9.2 10*3/uL (ref 4.0–10.5)

## 2011-02-24 LAB — URINE CULTURE: Colony Count: >=100000

## 2011-02-24 LAB — URINALYSIS, ROUTINE W REFLEX MICROSCOPIC
Bilirubin Urine: NEGATIVE
Glucose, UA: NEGATIVE mg/dL
Glucose, UA: NEGATIVE mg/dL
Glucose, UA: NEGATIVE mg/dL
Ketones, ur: 15 mg/dL — AB
Ketones, ur: 15 mg/dL — AB
Ketones, ur: NEGATIVE mg/dL
Ketones, ur: NEGATIVE mg/dL
Leukocytes, UA: NEGATIVE
Leukocytes, UA: NEGATIVE
Nitrite: NEGATIVE
Nitrite: NEGATIVE
Specific Gravity, Urine: 1.019 (ref 1.005–1.030)
Specific Gravity, Urine: 1.024 (ref 1.005–1.030)
Specific Gravity, Urine: 1.036 — ABNORMAL HIGH (ref 1.005–1.030)
Specific Gravity, Urine: 1.036 — ABNORMAL HIGH (ref 1.005–1.030)
Urobilinogen, UA: 0.2 mg/dL (ref 0.0–1.0)
pH: 5 (ref 5.0–8.0)
pH: 5 (ref 5.0–8.0)
pH: 8 (ref 5.0–8.0)

## 2011-02-24 LAB — COMPREHENSIVE METABOLIC PANEL
ALT: 11 U/L (ref 0–53)
Alkaline Phosphatase: 92 U/L (ref 39–117)
BUN: 8 mg/dL (ref 6–23)
CO2: 27 mEq/L (ref 19–32)
Chloride: 103 mEq/L (ref 96–112)
GFR calc non Af Amer: >60 mL/min (ref >60)
Glucose, Bld: 100 mg/dL — ABNORMAL HIGH (ref 70–99)
Potassium: 4.1 mEq/L (ref 3.5–5.1)
Sodium: 135 mEq/L (ref 135–145)
Total Bilirubin: 0.8 mg/dL (ref 0.3–1.2)

## 2011-02-24 LAB — CULTURE, BLOOD (ROUTINE X 2)
Culture: NO GROWTH
Culture: NO GROWTH

## 2011-02-24 LAB — CLOSTRIDIUM DIFFICILE EIA

## 2011-02-24 LAB — MAGNESIUM
Magnesium: 1.5 mg/dL (ref 1.5–2.5)
Magnesium: 1.7 mg/dL (ref 1.5–2.5)
Magnesium: 1.9 mg/dL (ref 1.5–2.5)

## 2011-02-24 LAB — URINE MICROSCOPIC-ADD ON

## 2011-02-24 LAB — POCT I-STAT, CHEM 8
BUN: <3 mg/dL — ABNORMAL LOW (ref 6–23)
Calcium, Ion: 1.11 mmol/L — ABNORMAL LOW (ref 1.12–1.32)
Chloride: 103 mEq/L (ref 96–112)
Glucose, Bld: 88 mg/dL (ref 70–99)
HCT: 31 % — ABNORMAL LOW (ref 39.0–52.0)
Potassium: 3.2 mEq/L — ABNORMAL LOW (ref 3.5–5.1)

## 2011-02-24 LAB — DIFFERENTIAL
Eosinophils Absolute: 0.3 10*3/uL (ref 0.0–0.7)
Eosinophils Relative: 3 % (ref 0–5)
Lymphs Abs: 1.2 10*3/uL (ref 0.7–4.0)
Monocytes Absolute: 0.7 10*3/uL (ref 0.1–1.0)
Monocytes Relative: 7 % (ref 3–12)
Neutrophils Relative %: 79 % — ABNORMAL HIGH (ref 43–77)

## 2011-02-24 LAB — PHOSPHORUS: Phosphorus: 3.9 mg/dL (ref 2.3–4.6)

## 2011-02-24 LAB — CREATININE, FLUID (PLEURAL, PERITONEAL, JP DRAINAGE)

## 2011-02-26 ENCOUNTER — Inpatient Hospital Stay (HOSPITAL_COMMUNITY)
Admission: EM | Admit: 2011-02-26 | Discharge: 2011-02-27 | DRG: 392 | Disposition: A | Discharge status: Home / Self Care | Payer: Medicaid Other | Attending: Internal Medicine | Admitting: Internal Medicine

## 2011-02-26 ENCOUNTER — Emergency Department (HOSPITAL_COMMUNITY): Payer: Medicaid Other

## 2011-02-26 DIAGNOSIS — A088 Other specified intestinal infections: Principal | ICD-10-CM | POA: Diagnosis present

## 2011-02-26 DIAGNOSIS — R112 Nausea with vomiting, unspecified: Secondary | ICD-10-CM | POA: Diagnosis present

## 2011-02-26 DIAGNOSIS — E876 Hypokalemia: Secondary | ICD-10-CM | POA: Diagnosis present

## 2011-02-26 DIAGNOSIS — F17200 Nicotine dependence, unspecified, uncomplicated: Secondary | ICD-10-CM | POA: Diagnosis present

## 2011-02-26 LAB — BASIC METABOLIC PANEL
CO2: 26 mEq/L (ref 19–32)
Glucose, Bld: 113 mg/dL — ABNORMAL HIGH (ref 70–99)
Potassium: 3 mEq/L — ABNORMAL LOW (ref 3.5–5.1)
Sodium: 141 mEq/L (ref 135–145)

## 2011-02-26 LAB — DIFFERENTIAL
Basophils Relative: 1 % (ref 0–1)
Monocytes Absolute: 0.9 10*3/uL (ref 0.1–1.0)
Monocytes Relative: 11 % (ref 3–12)
Neutro Abs: 4.5 10*3/uL (ref 1.7–7.7)

## 2011-02-26 LAB — CBC
Hemoglobin: 12.3 g/dL — ABNORMAL LOW (ref 13.0–17.0)
MCH: 26.2 pg (ref 26.0–34.0)
MCHC: 32.9 g/dL (ref 30.0–36.0)

## 2011-02-26 LAB — COMPREHENSIVE METABOLIC PANEL
AST: 22 U/L (ref 0–37)
CO2: 25 mEq/L (ref 19–32)
Calcium: 8.9 mg/dL (ref 8.4–10.5)
Creatinine, Ser: 1.08 mg/dL (ref 0.4–1.5)
GFR calc Af Amer: >60 mL/min (ref >60)
GFR calc non Af Amer: >60 mL/min (ref >60)
Glucose, Bld: 106 mg/dL — ABNORMAL HIGH (ref 70–99)

## 2011-02-26 LAB — PHOSPHORUS: Phosphorus: 3.3 mg/dL (ref 2.3–4.6)

## 2011-02-26 LAB — LIPASE, BLOOD: Lipase: 35 U/L (ref 11–59)

## 2011-02-26 LAB — MAGNESIUM: Magnesium: 1.9 mg/dL (ref 1.5–2.5)

## 2011-02-26 LAB — RAPID URINE DRUG SCREEN, HOSP PERFORMED: Barbiturates: NOT DETECTED

## 2011-02-27 LAB — COMPREHENSIVE METABOLIC PANEL
ALT: 15 U/L (ref 0–53)
AST: 27 U/L (ref 0–37)
Alkaline Phosphatase: 68 U/L (ref 39–117)
CO2: 23 mEq/L (ref 19–32)
GFR calc Af Amer: >60 mL/min (ref >60)
Glucose, Bld: 100 mg/dL — ABNORMAL HIGH (ref 70–99)
Potassium: 3.6 mEq/L (ref 3.5–5.1)
Sodium: 140 mEq/L (ref 135–145)
Total Protein: 6.4 g/dL (ref 6.0–8.3)

## 2011-02-27 LAB — CBC
HCT: 37.8 % — ABNORMAL LOW (ref 39.0–52.0)
Hemoglobin: 11.9 g/dL — ABNORMAL LOW (ref 13.0–17.0)
RDW: 20.2 % — ABNORMAL HIGH (ref 11.5–15.5)
WBC: 7.1 10*3/uL (ref 4.0–10.5)

## 2011-02-27 LAB — C-REACTIVE PROTEIN: CRP: 0.1 mg/dL — ABNORMAL LOW (ref <0.6)

## 2011-03-06 NOTE — Discharge Summary (Signed)
Jermaine Herring, Jermaine Herring              ACCOUNT NO.:  [id number]  MEDICAL RECORD NO.:  [id number]           PATIENT TYPE:  I  LOCATION:  4703                         FACILITY:  [facility]  PHYSICIAN:  Jeoffrey Massed, MD    DATE OF BIRTH:  1971-11-13  DATE OF ADMISSION:  02/26/2011 DATE OF DISCHARGE:                        DISCHARGE SUMMARY - REFERRING   PRIMARY GASTROENTEROLOGIST:  Everardo All. Madilyn Fireman, MD, [facility].  PRIMARY SURGEON:  Maisie Fus A. Cornett, MD  PRIMARY DISCHARGE DIAGNOSES: 1. Likely viral gastroenteritis, now resolved. 2. Hypokalemia secondary to above, now resolved.  SECONDARY DISCHARGE DIAGNOSES: 1. History of Crohn disease status post multiple surgeries including     sigmoid colectomy. 2. History of diverticular disease. 3. History of questionable gastroesophageal reflux disease. 4. History of nephrolithiasis. 5. Morbid obesity.  DISCHARGE MEDICATIONS:  Zofran 4 mg 1 tablet p.o. q.6 h p.r.n. for nausea and vomiting.  CONSULTATION:  None.  BRIEF HISTORY OF PRESENT ILLNESS:  The patient is a 40 year old African- American male with a history of Crohn disease and diverticular disease status post sigmoid colectomy and multiple other surgeries thereafter, who was admitted on February 26, 2011, with nausea, vomiting, and diarrhea that was going on for 3-4 days prior to his hospitalization.  He was found to have profound hypokalemia and then admitted to the Hospitalist Service for further evaluation and treatment.  He denied any abdominal pain than his usual baseline.  He did not have any blood in his stools as well.  For further details, please see the history and physical that was dictated by Dr. Mikeal Hawthorne on admission.  PERTINENT RADIOLOGICAL STUDIES:  Acute abdominal series done on February 26, 2011, showed no acute abnormalities.  PERTINENT LABORATORY DATA:  Potassium on admission was 2.4.  Urinary drug screen was positive for opiates and tetrahydrocannabinol.  ESR was 5.   Discharge chemistry showed potassium of 3.6.  BRIEF HOSPITAL COURSE:  Nausea, vomiting, and diarrhea.  The patient was afebrile.  This was probably a viral process and was not felt to be secondary to his Crohn disease exacerbation.  He did not have leukocytosis.  His inflammatory markers in terms of his CRP and ESR were also within the normal range.  He was given supportive therapy in terms of IV fluids and antiemetics and spontaneously improved on his own.  He has had no further diarrhea since admission and stool studies have not been able to be sent.  He has had no nausea or vomiting for the past 24 hours and is tolerating a full liquid diet and is anxious to be discharged home.  He apparently told the nurse today that if he is not discharged today, he is going to sign out against medical advice.  On my evaluation, his abdomen is very soft, nontender, and as noted above he has had no further nausea, vomiting, or diarrhea.  He is anxious to go home.  I have instructed him to slowly advance diet to low-residual diet for the next 3-4 days and then to advance to his usual diet.  He claims understanding.  He claims he has follow up with Dr. Madilyn Fireman in the first week  of April and with Dr. Luisa Hart in the latter half of April.  The patient will be discharged on p.r.n. Zofran.  DISPOSITION:  The patient is considered stable to be discharged home.  FOLLOWUP INSTRUCTIONS: 1. The patient to follow up with Dr. Madilyn Fireman at [facility] at his next     scheduled appointment. 2. The patient to follow up with Dr. Luisa Hart at [facility] at his next scheduled appointment.  Total time spent equals 45 minutes.     Jeoffrey Massed, MD     SG/MEDQ  D:  02/27/2011  T:  02/27/2011  Job:  [id number]  cc:   Everardo All. Madilyn Fireman, M.D. Thomas A. Cornett, M.D.  Electronically Signed by Jeoffrey Massed  on 03/06/2011 04:45:34 AM

## 2011-03-19 NOTE — H&P (Signed)
NAMETIP, ATIENZA              ACCOUNT NO.:  [id number]  MEDICAL RECORD NO.:  [id number]           PATIENT TYPE:  E  LOCATION:  [facility]                         FACILITY:  [facility]  PHYSICIAN:  Lonia Blood, M.D.      DATE OF BIRTH:  [birth year]  DATE OF ADMISSION:  02/26/2011 DATE OF DISCHARGE:                             HISTORY & PHYSICAL   PRIMARY CARE PHYSICIAN:  He is unassigned to [place].  DISCHARGE SURGEON:  Wilmon Arms. Tsuei, MD  PRESENTING COMPLAINT:  Abdominal pain, nausea, vomiting, and diarrhea. This has been going on for 6 days.  HISTORY OF PRESENT ILLNESS:  The patient is a 40 year old gentleman with known history of Crohn's disease and diverticulitis who is status post sigmoid colectomy and has had multiple surgeries thereafter.  He has been in and out of the hospital with multiple types of complaints including mainly abdominal pain and problems related to his Crohn disease.  He follows with Dr. Madilyn Fireman, [facility] as his gastroenterologist. The patient is here today complaining of 6 days of abdominal pain, nausea, vomiting, and diarrhea.  He denied any chills but has had some mild fever along the line.  No hematemesis, no melena.  No bright red blood per rectum.  The patient has, however, felt weak and tired.  He has been in the ER several times in the past but for the last 3 months has been away from the ER.  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 History of diverticulitis, GERD, kidney stone, morbid obesity.  ALLERGIES:  No known drug allergies.  MEDICATIONS:  Currently none.  SOCIAL HISTORY:  The [place] lives in [place].  He smokes half pack per day.  Occasional alcohol.  No IV drug use.  FAMILY HISTORY:  Denied any significant family history to this point as well as his hospitalization is  concerned.  REVIEW OF SYSTEMS:  All systems reviewed are negative except per HPI.  PHYSICAL EXAMINATION:  VITAL SIGNS:  Temperature 98.0, blood pressure 115/99, pulse 94, respiratory rate 19, sats 98% on room air. GENERAL:  The patient is morbidly obese, stable in no acute distress. HEENT:  PERRL.  EOMI.  No pallor, no jaundice.  No rhinorrhea. NECK:  Supple.  No JVD, no lymphadenopathy. RESPIRATORY:  He has good air entry bilaterally.  No wheezes, no rales, no crackles. CARDIOVASCULAR SYSTEM:  He has S1, S2.  No audible murmur. ABDOMEN:  Obese, soft, mild discomfort but nontender.  Positive bowel sounds.  He has multiple previous surgical scars. EXTREMITIES:  No edema, cyanosis, or clubbing. SKIN:  Mainly scars in his abdominal wall from previous surgeries. Otherwise no rashes, no ulcers.  LABORATORY DATA:  Lipase is 35.  Sodium 141, potassium 2.4, chloride 103, CO2 25, glucose 106, BUN 4, creatinine 1.04.  The rest of his LFTs are within normal, calcium 8.9.  White count is 7.9, hemoglobin 12.3, platelets 303 with normal differentials.  Acute abdominal series shows no acute abnormalities.  ASSESSMENT:  This is a  40 year old gentleman with known history of Crohn disease and chronic abdominal pain presenting with nausea, vomiting, and diarrhea.  More than likely his symptoms are caused less likely by his Crohn disease.  It could be simple viral gastroenteritis.  It could also be any other cause like food poisoning.  The patient's symptoms are receding except that he has severe hypokalemia with potassium 2.4.  PLAN: 1. Profound hypokalemia.  We will admit the patient.  Symptomatically     treat his nausea, vomiting, diarrhea.  I will give him couple of     rounds of potassium and if his nausea improves, I will also give     him oral potassium. 2. Persistent nausea, vomiting, diarrhea.  Again I will check stool     for O and P as well as C. diff PCR, although there is no recent  use     of antibiotics but the patient has had numerous ER visits.  I will     hydrate him and use Zofran and Phenergan to control his symptoms. 3. Chronic abdominal pain.  Continue with pain control over here. 4. GERD.  I will give him PPIs, although the patient is not taking at     home. 5. Tobacco abuse.  I will start nicotine patch and also ask for     tobacco cessation counseling.     Lonia Blood, M.D.     Verlin Grills  D:  02/26/2011  T:  02/26/2011  Job:  [id number]  Electronically Signed by Lonia Blood M.D. on 03/19/2011 03:51:10 PM

## 2011-04-16 NOTE — H&P (Signed)
NAMEPEARSON, PICOU              ACCOUNT NO.:  [id number]   MEDICAL RECORD NO.:  [id number]          PATIENT TYPE:  INP   LOCATION:  5123                         FACILITY:  [facility]   PHYSICIAN:  Della Goo, M.D. DATE OF BIRTH:  16-Nov-1971   DATE OF ADMISSION:  02/13/2008  DATE OF DISCHARGE:                              HISTORY & PHYSICAL   PRIMARY CARE PHYSICIAN:  Unassigned.   CHIEF COMPLAINT:  Abdominal pain.   HISTORY OF PRESENT ILLNESS:  This is a 40 year old male presenting to  the emergency department for evaluation secondary to complaints of  severe abdominal pain.  The patient reports having abdominal pain and  nausea, vomiting, and diarrhea for 2 weeks.  He was seen in the  emergency department on February 08, 2008, evaluated, and found to have a  diverticulitis by CT scan.  He was sent home on medical therapy of  antibiotics, a fluoroquinolone, Flagyl, and oxycodone p.r.n. pain.  The  patient returns with worsening abdominal pain in the left lower quadrant  area..  He denies having any fevers or chills.  The patient denies  having any chest pain or shortness of breath.   PAST MEDICAL HISTORY PRIOR TO THIS:  None.   MEDICATIONS:  Mentioned above.   ALLERGIES:  NO KNOWN DRUG ALLERGIES.   SOCIAL HISTORY:  The patient is a nonsmoker, nondrinker.   FAMILY HISTORY:  Noncontributory.   REVIEW OF SYSTEMS:  Pertinents mentioned above.   PHYSICAL EXAMINATION FINDINGS:  GENERAL:  This is a morbidly obese 89-  year-old male, in discomfort but no acute distress.  VITAL SIGNS: Temperature 98.9, blood pressure 140/79, heart rate 105,  respirations 24.  HEENT:  Normocephalic, atraumatic.  Pupils equally round, reactive to  light.  Extraocular muscles are intact.  Funduscopic benign.  Oropharynx  is clear.  NECK:  Supple.  Full range of motion.  No thyromegaly, adenopathy, or  jugulovenous distention.  CARDIOVASCULAR:  Regular rate and rhythm.  No murmurs, gallops, or rubs.  LUNGS:  Clear to auscultation bilaterally.  ABDOMEN:  Obese.  Positive bowel sounds.  Soft, nontender, nondistended,  except for the left lower quadrant area.  There is no rebound or  tenderness.  EXTREMITIES:  Without cyanosis, clubbing, or edema.  NEUROLOGIC  Nonfocal.   LABORATORY STUDIES:  White blood cell count 16.6, hemoglobin 15.7,  hematocrit 47.8, platelets 480, neutrophils 82%, lymphocytes 9%.  Chemistry with a sodium of 134, potassium 3.9, chloride 102, bicarbonate  23, BUN 5, creatinine 1.1, and glucose 96, albumin 3.3, AST 14, ALT 17.  Urinalysis:  Positive nitrites, and positive moderate leukocyte  esterase.  CT scan of the abdomen and pelvis is pending at this time.   ASSESSMENT:  A 40 year old male being admitted with:  1. Abdominal pain.  2. Diverticulitis.  3. Urinary tract infection.  4. Tachycardia.   PLAN:  The patient will be admitted to a telemetry area. and will be  made n.p.o. for now.  The patient will be placed on IV fluids, IV  antibiotic therapy of IV Cipro, and metronidazole therapy.  A urine  culture will also be  sent, and studies for gonorrhea and Chlamydia from  the urine as well will be sent.  Further antibiotic treatment will be  further adjusted pending culture results.  The patient will be  administered pain control therapy as needed.  Further evaluation and  therapies will ensue pending results of his studies.      Della Goo, M.D.  Electronically Signed     HJ/MEDQ  D:  02/14/2008  T:  02/15/2008  Job:  [id number]

## 2011-04-16 NOTE — Discharge Summary (Signed)
Jermaine Herring, Jermaine Herring NO.:  [id number]   MEDICAL RECORD NO.:  [id number]          PATIENT TYPE:  INP   LOCATION:  5123                         FACILITY:  [facility]   PHYSICIAN:  Isidor Holts, M.D.  DATE OF BIRTH:  May 13, 1971   DATE OF ADMISSION:  02/13/2008  DATE OF DISCHARGE:  02/16/2008                               DISCHARGE SUMMARY   PRIMARY CARE PHYSICIAN:  Unassigned.   DISCHARGE DIAGNOSES:  1. Contained diverticular abscess.  2. Acute sigmoid diverticulitis.  3. Urinary tract infection.  4. Morbid obesity.   DISCHARGE MEDICATIONS:  1. Ciprofloxacin 500 mg p.o. b.i.d. for 2 weeks.  2. Flagyl 500 mg p.o. 3 times a day for 2 weeks.   PROCEDURES:  Abdominal and pelvic CT scan dated February 14, 2008.  This  showed no intra-abdominal findings.  However, pelvic CT scan showed  continued findings diverticulitis with interval development of  approximately 4 cm pericolonic abscess.   CONSULTATIONS:  Dorothey Baseman, MD, general surgeon.   ADMISSION HISTORY:  As in H&P notes of February 13, 2008, dictated by Dr.  Della Goo.  However in brief, this is a 40 year old man, with no  significant past medical history, who had been seen in the emergency  department on February 08, 2008.  Evaluated, diagnosed with acute  diverticulitis and sent home on medical therapy including  fluoroquinolone, Flagyl and p.r.n. oxycodone.  He now represents with  severe abdominal pain, associated with nausea, vomiting and diarrhea for  2 weeks.  He was admitted for further evaluation, investigation and  management.   CLINICAL COURSE:  #1.  DIVERTICULAR ABSCESS:  For details of presentation, refer to  admission history above. The patient's abdominal CT scan of February 14, 2008, showed continued sigmoid diverticulitis with pericolonic 4 cm  intra-abdominal abscess. Clearly, the patient had developed a  diverticular abscess which clinically, was contained. He was commenced  on bowel rest,  intravenous fluid hydration, and parenteral Flagyl and  Ciprofloxacin. Surgical consultation was kindly provided by Dr. Dorothey Baseman.  The patient did well on above-mentioned treatment regimen with  resolution of nausea, vomiting and other GI symptoms. By February 14, 2008,  he was practically symptom free.  White cell count, which was initially  elevated at 16.6, had by February 16, 2008, dropped down to 5.3.  The  patient had no episodes of pyrexia during the course of his  hospitalization. On February 16, 2008, he was seen by the surgeons and  recommended transitioning to oral antibiotics, advancement of diet, with  subsequent surgical followup.  The patient was able to tolerate a  regular diet without any deleterious symptoms whatsoever.  He was  subsequently discharged on oral Ciprofloxacin and Flagyl.   #2.  URINARY TRACT INFECTION:  The patient at the time of presentation,  had a positive urinary sediment consistent with urinary tract infection.  It was felt, however, that this was adequately covered by Ciprofloxacin.  Urine cultures have remained negative.   DISPOSITION:  The patient was on February 16, 2008, considered sufficiently  clinically recovered and stable to be discharged. Besides, he  was very  keen to go home.  He has,  therefore, been discharged accordingly.  The  patient is recommended not to return to regular duties or work, until  recommended so by Careers adviser.   ACTIVITY:  Recommended to increase activity slowly.   DIET:  No restrictions.   FOLLOWUP INSTRUCTIONS:  The patient is recommended to follow up with Dr.  Freida Busman, general surgeon, in 2 weeks.  He has been supplied with  appropriate information.      Isidor Holts, M.D.  Electronically Signed     CO/MEDQ  D:  02/16/2008  T:  02/16/2008  Job:  [id number]   cc:   Dorothey Baseman

## 2011-08-26 LAB — CBC
Hemoglobin: 13.1
MCHC: 32.9
MCHC: 33.4
MCV: 90.7
Platelets: 408 — ABNORMAL HIGH
Platelets: 430 — ABNORMAL HIGH
RBC: 5.33
RBC: 4.38
RBC: 4.49
RDW: 13.7
RDW: 13.9
WBC: 12.5 — ABNORMAL HIGH
WBC: 5.3

## 2011-08-26 LAB — BASIC METABOLIC PANEL WITH GFR
BUN: 4 — ABNORMAL LOW
CO2: 24
Calcium: 8.2 — ABNORMAL LOW
Chloride: 102
Creatinine, Ser: 0.95
GFR calc non Af Amer: >60
Glucose, Bld: 84
Potassium: 3.4 — ABNORMAL LOW
Sodium: 136

## 2011-08-26 LAB — COMPREHENSIVE METABOLIC PANEL
ALT: 17
ALT: 11
AST: 14
AST: 20
Albumin: 3.2 — ABNORMAL LOW
Alkaline Phosphatase: 75
BUN: 6
CO2: 23
CO2: 26
Calcium: 9
Calcium: 9
Calcium: 8.2 — ABNORMAL LOW
Chloride: 104
Chloride: 104
Creatinine, Ser: 1.05
GFR calc Af Amer: >60
GFR calc Af Amer: >60
GFR calc non Af Amer: >60
Glucose, Bld: 97
Sodium: 134 — ABNORMAL LOW
Sodium: 137
Total Bilirubin: 1.1
Total Bilirubin: 0.6
Total Protein: 7
Total Protein: 7.8

## 2011-08-26 LAB — DIFFERENTIAL
Basophils Absolute: 0
Basophils Absolute: 0.1
Basophils Relative: 0
Basophils Relative: 0
Eosinophils Absolute: 0.1
Eosinophils Absolute: 0.2
Eosinophils Relative: 1
Eosinophils Relative: 1
Lymphocytes Relative: 12
Lymphocytes Relative: 9 — ABNORMAL LOW
Lymphs Abs: 1.4
Lymphs Abs: 1.6
Monocytes Absolute: 1
Monocytes Absolute: 1.3 — ABNORMAL HIGH
Monocytes Relative: 8
Monocytes Relative: 8
Neutro Abs: 13.6 — ABNORMAL HIGH
Neutro Abs: 9.9 — ABNORMAL HIGH
Neutrophils Relative %: 79 — ABNORMAL HIGH
Neutrophils Relative %: 82 — ABNORMAL HIGH

## 2011-08-26 LAB — URINE CULTURE
Colony Count: NO GROWTH
Culture: NO GROWTH

## 2011-08-26 LAB — URINE MICROSCOPIC-ADD ON

## 2011-08-26 LAB — URINALYSIS, ROUTINE W REFLEX MICROSCOPIC
Glucose, UA: NEGATIVE
Ketones, ur: 15 — AB
Nitrite: POSITIVE — AB
Protein, ur: 100 — AB
Specific Gravity, Urine: 1.026
Specific Gravity, Urine: 1.033 — ABNORMAL HIGH
Urobilinogen, UA: 1
Urobilinogen, UA: 1
pH: 5.5
pH: 5.5

## 2011-08-26 LAB — GC/CHLAMYDIA PROBE AMP, URINE: GC Probe Amp, Urine: NEGATIVE

## 2012-07-18 ENCOUNTER — Encounter (HOSPITAL_COMMUNITY): Payer: Self-pay | Admitting: *Deleted

## 2012-07-18 ENCOUNTER — Emergency Department (HOSPITAL_COMMUNITY)
Admission: EM | Admit: 2012-07-18 | Discharge: 2012-07-18 | Disposition: A | Discharge status: Home / Self Care | Payer: Medicaid Other | Attending: Emergency Medicine | Admitting: Emergency Medicine

## 2012-07-18 ENCOUNTER — Emergency Department (HOSPITAL_COMMUNITY): Payer: Medicaid Other

## 2012-07-18 DIAGNOSIS — K5090 Crohn's disease, unspecified, without complications: Secondary | ICD-10-CM | POA: Insufficient documentation

## 2012-07-18 DIAGNOSIS — E669 Obesity, unspecified: Secondary | ICD-10-CM | POA: Insufficient documentation

## 2012-07-18 DIAGNOSIS — F17200 Nicotine dependence, unspecified, uncomplicated: Secondary | ICD-10-CM | POA: Insufficient documentation

## 2012-07-18 DIAGNOSIS — R202 Paresthesia of skin: Secondary | ICD-10-CM

## 2012-07-18 DIAGNOSIS — M5090 Cervical disc disorder, unspecified, unspecified cervical region: Secondary | ICD-10-CM

## 2012-07-18 DIAGNOSIS — R209 Unspecified disturbances of skin sensation: Secondary | ICD-10-CM | POA: Insufficient documentation

## 2012-07-18 HISTORY — DX: Obesity, unspecified: E66.9

## 2012-07-18 HISTORY — DX: Crohn's disease, unspecified, without complications: K50.90

## 2012-07-18 MED ORDER — PREDNISONE 10 MG PO TABS: 20.0000 mg | ORAL_TABLET | Freq: Every day | ORAL | Status: DC

## 2012-07-18 NOTE — ED Notes (Signed)
C/o numbness in L hand. Onset about 2 weeks ago. Gradually progressively worse. Radiates up to elbow. CMS intact, ROM intact. Denies other sx. Describes as painful.

## 2012-07-18 NOTE — ED Provider Notes (Signed)
History     CSN: [id number]  Arrival date & time 07/18/12  4098   First MD Initiated Contact with Patient 07/18/12 [id number]      Chief Complaint  Patient presents with  . Numbness    (Consider location/radiation/quality/duration/timing/severity/associated sxs/prior treatment) HPI Comments: Pt presents to the ed with numbness to left 4th and fifth fingers x 2wks.  Pt states is a [employer].  P states has had some minor pain in neck.  No injury.  Throbbing,  Non radiating.  No headache.  No chest pain.  No cough.    The history is provided by the patient.    Past Medical History  Diagnosis Date  . Crohn disease   . Obesity     Past Surgical History  Procedure Date  . Colon surgery     Family History  Problem Relation Age of Onset  . Cancer Mother     History  Substance Use Topics  . Smoking status: Current Everyday Smoker  . Smokeless tobacco: Not on file  . Alcohol Use: Yes      Review of Systems  Neurological: Positive for numbness.  All other systems reviewed and are negative.    Allergies  Review of patient's allergies indicates no known allergies.  Home Medications  No current outpatient prescriptions on file.  BP 123/88  Temp 98 F (36.7 C) (Oral)  Resp 18  SpO2 96%  Physical Exam  Constitutional: He is oriented to person, place, and time. He appears well-developed and well-nourished.  HENT:  Head: Normocephalic and atraumatic.  Eyes: Conjunctivae are normal. Pupils are equal, round, and reactive to light.  Neck: Normal range of motion. Neck supple.  Cardiovascular: Normal rate, regular rhythm, normal heart sounds and intact distal pulses.   Pulmonary/Chest: Effort normal and breath sounds normal.  Abdominal: Soft. Bowel sounds are normal.  Neurological: He is alert and oriented to person, place, and time.  Skin: Skin is warm and dry.  Psychiatric: He has a normal mood and affect. His behavior is normal. Judgment and thought content normal.      ED Course  Procedures (including critical care time)  Labs Reviewed - No data to display No results found.   No diagnosis found.    MDM  No increase in sxs with palpation of olcranon fossa or carpal tunnel.  Will xray cervical spine.  Think will mll need out pt mr.          Braeson Rupe Lytle Michaels, MD 07/18/12 [id number]

## 2013-02-07 ENCOUNTER — Emergency Department (HOSPITAL_COMMUNITY)
Admission: EM | Admit: 2013-02-07 | Discharge: 2013-02-07 | Disposition: A | Discharge status: Home / Self Care | Payer: Medicaid Other | Attending: Emergency Medicine | Admitting: Emergency Medicine

## 2013-02-07 ENCOUNTER — Emergency Department (HOSPITAL_COMMUNITY): Payer: Medicaid Other

## 2013-02-07 ENCOUNTER — Encounter (HOSPITAL_COMMUNITY): Payer: Self-pay | Admitting: *Deleted

## 2013-02-07 DIAGNOSIS — E669 Obesity, unspecified: Secondary | ICD-10-CM | POA: Insufficient documentation

## 2013-02-07 DIAGNOSIS — Z8719 Personal history of other diseases of the digestive system: Secondary | ICD-10-CM | POA: Insufficient documentation

## 2013-02-07 DIAGNOSIS — R1031 Right lower quadrant pain: Secondary | ICD-10-CM | POA: Insufficient documentation

## 2013-02-07 DIAGNOSIS — Z9049 Acquired absence of other specified parts of digestive tract: Secondary | ICD-10-CM | POA: Insufficient documentation

## 2013-02-07 DIAGNOSIS — R109 Unspecified abdominal pain: Secondary | ICD-10-CM

## 2013-02-07 DIAGNOSIS — F17200 Nicotine dependence, unspecified, uncomplicated: Secondary | ICD-10-CM | POA: Insufficient documentation

## 2013-02-07 DIAGNOSIS — R197 Diarrhea, unspecified: Secondary | ICD-10-CM | POA: Insufficient documentation

## 2013-02-07 DIAGNOSIS — E876 Hypokalemia: Secondary | ICD-10-CM

## 2013-02-07 LAB — COMPREHENSIVE METABOLIC PANEL
ALT: 9 U/L (ref 0–53)
CO2: 27 mEq/L (ref 19–32)
Calcium: 9.2 mg/dL (ref 8.4–10.5)
Chloride: 97 mEq/L (ref 96–112)
Creatinine, Ser: 1.01 mg/dL (ref 0.50–1.35)
GFR calc Af Amer: >90 mL/min (ref >90)
GFR calc non Af Amer: >90 mL/min (ref >90)
Glucose, Bld: 112 mg/dL — ABNORMAL HIGH (ref 70–99)
Total Bilirubin: 0.5 mg/dL (ref 0.3–1.2)

## 2013-02-07 LAB — CBC WITH DIFFERENTIAL/PLATELET
Eosinophils Relative: 4 % (ref 0–5)
HCT: 43.9 % (ref 39.0–52.0)
Hemoglobin: 15.3 g/dL (ref 13.0–17.0)
Lymphocytes Relative: 26 % (ref 12–46)
Lymphs Abs: 2 10*3/uL (ref 0.7–4.0)
MCV: 87.8 fL (ref 78.0–100.0)
Monocytes Absolute: 0.5 10*3/uL (ref 0.1–1.0)
Neutro Abs: 5 10*3/uL (ref 1.7–7.7)
RBC: 5 MIL/uL (ref 4.22–5.81)
WBC: 7.8 10*3/uL (ref 4.0–10.5)

## 2013-02-07 LAB — BASIC METABOLIC PANEL
BUN: 5 mg/dL — ABNORMAL LOW (ref 6–23)
Calcium: 8.2 mg/dL — ABNORMAL LOW (ref 8.4–10.5)
GFR calc Af Amer: >90 mL/min (ref >90)
GFR calc non Af Amer: >90 mL/min (ref >90)
Glucose, Bld: 91 mg/dL (ref 70–99)
Potassium: 2.9 mEq/L — ABNORMAL LOW (ref 3.5–5.1)

## 2013-02-07 LAB — POTASSIUM: Potassium: 3.3 mEq/L — ABNORMAL LOW (ref 3.5–5.1)

## 2013-02-07 MED ORDER — IOHEXOL 300 MG/ML  SOLN
100.0000 mL | Freq: Once | INTRAMUSCULAR | Status: AC | PRN
  Administered 2013-02-07: 100 mL via INTRAVENOUS

## 2013-02-07 MED ORDER — ONDANSETRON HCL 4 MG/2ML IJ SOLN
4.0000 mg | Freq: Once | INTRAMUSCULAR | Status: AC
  Administered 2013-02-07: 4 mg via INTRAVENOUS
  Filled 2013-02-07: qty 2

## 2013-02-07 MED ORDER — MORPHINE SULFATE 4 MG/ML IJ SOLN
4.0000 mg | Freq: Once | INTRAMUSCULAR | Status: AC
  Administered 2013-02-07: 4 mg via INTRAVENOUS
  Filled 2013-02-07: qty 1

## 2013-02-07 MED ORDER — IOHEXOL 300 MG/ML  SOLN
50.0000 mL | Freq: Once | INTRAMUSCULAR | Status: AC | PRN
  Administered 2013-02-07: 50 mL via ORAL

## 2013-02-07 MED ORDER — POTASSIUM CHLORIDE 10 MEQ/100ML IV SOLN
10.0000 meq | INTRAVENOUS | Status: AC
  Administered 2013-02-07 (×3): 10 meq via INTRAVENOUS
  Filled 2013-02-07 (×3): qty 100

## 2013-02-07 MED ORDER — POTASSIUM CHLORIDE CRYS ER 20 MEQ PO TBCR
40.0000 meq | EXTENDED_RELEASE_TABLET | Freq: Once | ORAL | Status: AC
  Administered 2013-02-07: 40 meq via ORAL
  Filled 2013-02-07: qty 2

## 2013-02-07 MED ORDER — HYDROCODONE-ACETAMINOPHEN 5-325 MG PO TABS: 1.0000 | ORAL_TABLET | ORAL | Status: DC | PRN

## 2013-02-07 MED ORDER — SODIUM CHLORIDE 0.9 % IV BOLUS (SEPSIS)
1000.0000 mL | Freq: Once | INTRAVENOUS | Status: AC
  Administered 2013-02-07: 1000 mL via INTRAVENOUS

## 2013-02-07 MED ORDER — ONDANSETRON 4 MG PO TBDP: ORAL_TABLET | ORAL | Status: DC

## 2013-02-07 NOTE — ED Provider Notes (Signed)
History     CSN: [id number]  Arrival date & time 02/07/13  0131   First MD Initiated Contact with Patient 02/07/13 0440      Chief Complaint  Patient presents with  . Abdominal Pain    (Consider location/radiation/quality/duration/timing/severity/associated sxs/prior treatment) HPI 42 year old male presents to emergency apartment with complaint of right lower abdominal pain starting 6 days ago, worsening since Thursday.  Pt with chronic abd pain, dull, usually able to control at home.  History of crohns, bowel resection, sbo.  Pt reports n/v since Thursday, no urine output since that time.  No fever, chills.  Has been having very loose stools until yesterday.  Past Medical History  Diagnosis Date  . Crohn disease   . Obesity     Past Surgical History  Procedure Laterality Date  . Colon surgery      Family History  Problem Relation Age of Onset  . Cancer Mother     History  Substance Use Topics  . Smoking status: Current Every Day Smoker  . Smokeless tobacco: Not on file  . Alcohol Use: Yes      Review of Systems  All other systems reviewed and are negative.    Allergies  Mushroom extract complex  Home Medications   Current Outpatient Rx  Name  Route  Sig  Dispense  Refill  . POTASSIUM PO   Oral   Take 1 tablet by mouth daily as needed (for dizziness or passing out).           BP 140/96  Pulse 87  Temp(Src) 98.7 F (37.1 C) (Oral)  Resp 16  SpO2 97%  Physical Exam  Nursing note and vitals reviewed. Constitutional: He is oriented to person, place, and time. He appears well-developed and well-nourished. No distress.  HENT:  Head: Normocephalic and atraumatic.  Nose: Nose normal.  Mouth/Throat: Oropharynx is clear and moist.  Eyes: Conjunctivae and EOM are normal. Pupils are equal, round, and reactive to light.  Neck: Normal range of motion. Neck supple. No JVD present. No tracheal deviation present. No thyromegaly present.  Cardiovascular:  Normal rate, regular rhythm, normal heart sounds and intact distal pulses.  Exam reveals no gallop and no friction rub.   No murmur heard. Pulmonary/Chest: Effort normal and breath sounds normal. No stridor. No respiratory distress. He has no wheezes. He has no rales. He exhibits no tenderness.  Abdominal: Soft. He exhibits no distension and no mass. There is no tenderness. There is no rebound and no guarding.  Decreased bowel sounds, right lower abd pain with palpation.  Surgical scars noted.    Musculoskeletal: Normal range of motion. He exhibits no edema and no tenderness.  Lymphadenopathy:    He has no cervical adenopathy.  Neurological: He is alert and oriented to person, place, and time. He exhibits normal muscle tone. Coordination normal.  Skin: Skin is warm and dry. No rash noted. No erythema. No pallor.  Psychiatric: He has a normal mood and affect. His behavior is normal. Judgment and thought content normal.    ED Course  Procedures (including critical care time)  Labs Reviewed  CBC WITH DIFFERENTIAL - Abnormal; Notable for the following:    RDW 17.4 (*)    All other components within normal limits  COMPREHENSIVE METABOLIC PANEL - Abnormal; Notable for the following:    Potassium 2.7 (*)    Glucose, Bld 112 (*)    All other components within normal limits  LIPASE, BLOOD - Abnormal; Notable for the following:  Lipase 65 (*)    All other components within normal limits  URINALYSIS, ROUTINE W REFLEX MICROSCOPIC   No results found.   No diagnosis found.    MDM  42 yo male with abd pain, h/o crohns.  Concern for SBO, crohn's flare.  Less likely gastrotenteritis, kidney stone.  Hypokalemia noted, will replete.  Slightly elevated lipase, possible from vomiting. Will get ct abd/pelvis.  Care passed to Dr Ranae Palms awaiting CT results.          Olivia Mackie, MD 02/07/13 [phone]

## 2013-02-07 NOTE — ED Notes (Addendum)
Pt states right lower pain that is constant but intermitantly sharp. Pt states he has been having diarhea and problems urinating, which is unusual for him. Pt has chron's disease states that this may be a flare up but that it feels worse then his previous ones.

## 2013-02-07 NOTE — ED Notes (Signed)
Pt returns from radiology. 

## 2013-02-07 NOTE — ED Notes (Signed)
Pt in CT scan at the time 

## 2013-08-03 ENCOUNTER — Inpatient Hospital Stay (HOSPITAL_COMMUNITY)
Admission: EM | Admit: 2013-08-03 | Discharge: 2013-08-06 | DRG: 287 | Disposition: A | Discharge status: Home / Self Care | Payer: Medicaid Other | Attending: Internal Medicine | Admitting: Internal Medicine

## 2013-08-03 ENCOUNTER — Emergency Department (HOSPITAL_COMMUNITY): Payer: Medicaid Other

## 2013-08-03 ENCOUNTER — Encounter (HOSPITAL_COMMUNITY): Payer: Self-pay | Admitting: Emergency Medicine

## 2013-08-03 DIAGNOSIS — I4729 Other ventricular tachycardia: Secondary | ICD-10-CM | POA: Diagnosis present

## 2013-08-03 DIAGNOSIS — I472 Ventricular tachycardia, unspecified: Secondary | ICD-10-CM | POA: Diagnosis not present

## 2013-08-03 DIAGNOSIS — R599 Enlarged lymph nodes, unspecified: Secondary | ICD-10-CM | POA: Diagnosis not present

## 2013-08-03 DIAGNOSIS — I428 Other cardiomyopathies: Secondary | ICD-10-CM | POA: Diagnosis present

## 2013-08-03 DIAGNOSIS — I509 Heart failure, unspecified: Secondary | ICD-10-CM | POA: Diagnosis present

## 2013-08-03 DIAGNOSIS — F1911 Other psychoactive substance abuse, in remission: Secondary | ICD-10-CM | POA: Diagnosis present

## 2013-08-03 DIAGNOSIS — E669 Obesity, unspecified: Secondary | ICD-10-CM | POA: Diagnosis present

## 2013-08-03 DIAGNOSIS — D509 Iron deficiency anemia, unspecified: Secondary | ICD-10-CM | POA: Diagnosis present

## 2013-08-03 DIAGNOSIS — Z720 Tobacco use: Secondary | ICD-10-CM | POA: Diagnosis present

## 2013-08-03 DIAGNOSIS — E876 Hypokalemia: Secondary | ICD-10-CM

## 2013-08-03 DIAGNOSIS — I5021 Acute systolic (congestive) heart failure: Principal | ICD-10-CM

## 2013-08-03 DIAGNOSIS — F17200 Nicotine dependence, unspecified, uncomplicated: Secondary | ICD-10-CM | POA: Diagnosis present

## 2013-08-03 DIAGNOSIS — Z6841 Body Mass Index (BMI) 40.0 and over, adult: Secondary | ICD-10-CM

## 2013-08-03 DIAGNOSIS — I10 Essential (primary) hypertension: Secondary | ICD-10-CM | POA: Diagnosis present

## 2013-08-03 DIAGNOSIS — K5090 Crohn's disease, unspecified, without complications: Secondary | ICD-10-CM | POA: Diagnosis present

## 2013-08-03 DIAGNOSIS — M109 Gout, unspecified: Secondary | ICD-10-CM | POA: Diagnosis not present

## 2013-08-03 HISTORY — DX: Other ventricular tachycardia: I47.29

## 2013-08-03 HISTORY — DX: Other cardiomyopathies: I42.8

## 2013-08-03 HISTORY — DX: Iron deficiency anemia, unspecified: D50.9

## 2013-08-03 HISTORY — DX: Other psychoactive substance abuse, in remission: F19.11

## 2013-08-03 HISTORY — DX: Tobacco use: Z72.0

## 2013-08-03 HISTORY — DX: Ventricular tachycardia: I47.2

## 2013-08-03 HISTORY — DX: Chronic systolic (congestive) heart failure: I50.22

## 2013-08-03 HISTORY — DX: Diverticulitis of intestine, part unspecified, without perforation or abscess without bleeding: K57.92

## 2013-08-03 LAB — PRO B NATRIURETIC PEPTIDE: Pro B Natriuretic peptide (BNP): 1645 pg/mL — ABNORMAL HIGH (ref 0–125)

## 2013-08-03 LAB — CBC WITH DIFFERENTIAL/PLATELET
Blasts: 0 %
Lymphocytes Relative: 28 % (ref 12–46)
Lymphs Abs: 1.1 10*3/uL (ref 0.7–4.0)
Monocytes Absolute: 0.2 10*3/uL (ref 0.1–1.0)
Monocytes Relative: 5 % (ref 3–12)
Neutro Abs: 2.4 10*3/uL (ref 1.7–7.7)
Neutrophils Relative %: 63 % (ref 43–77)
Platelets: 449 10*3/uL — ABNORMAL HIGH (ref 150–400)
RBC: 4.73 MIL/uL (ref 4.22–5.81)
RDW: 20.3 % — ABNORMAL HIGH (ref 11.5–15.5)
WBC: 3.9 10*3/uL — ABNORMAL LOW (ref 4.0–10.5)
nRBC: 0 /100 WBC

## 2013-08-03 LAB — COMPREHENSIVE METABOLIC PANEL
Alkaline Phosphatase: 74 U/L (ref 39–117)
BUN: 7 mg/dL (ref 6–23)
Calcium: 8.7 mg/dL (ref 8.4–10.5)
Creatinine, Ser: 1.09 mg/dL (ref 0.50–1.35)
GFR calc Af Amer: >90 mL/min (ref >90)
Glucose, Bld: 96 mg/dL (ref 70–99)
Potassium: 3.4 mEq/L — ABNORMAL LOW (ref 3.5–5.1)
Total Protein: 7.4 g/dL (ref 6.0–8.3)

## 2013-08-03 LAB — URINALYSIS, ROUTINE W REFLEX MICROSCOPIC
Bilirubin Urine: NEGATIVE
Ketones, ur: NEGATIVE mg/dL
Nitrite: NEGATIVE
Specific Gravity, Urine: 1.016 (ref 1.005–1.030)
Urobilinogen, UA: 1 mg/dL (ref 0.0–1.0)

## 2013-08-03 LAB — TSH: TSH: 3.13 u[IU]/mL (ref 0.350–4.500)

## 2013-08-03 LAB — LIPASE, BLOOD: Lipase: 58 U/L (ref 11–59)

## 2013-08-03 LAB — TROPONIN I: Troponin I: <0.3 ng/mL (ref <0.30)

## 2013-08-03 LAB — MRSA PCR SCREENING: MRSA by PCR: NEGATIVE

## 2013-08-03 LAB — URINE MICROSCOPIC-ADD ON

## 2013-08-03 LAB — POCT I-STAT TROPONIN I

## 2013-08-03 MED ORDER — ALPRAZOLAM 0.25 MG PO TABS
0.2500 mg | ORAL_TABLET | Freq: Two times a day (BID) | ORAL | Status: DC | PRN
  Administered 2013-08-03: 0.25 mg via ORAL
  Filled 2013-08-03: qty 1

## 2013-08-03 MED ORDER — MORPHINE SULFATE 2 MG/ML IJ SOLN
2.0000 mg | Freq: Four times a day (QID) | INTRAMUSCULAR | Status: DC | PRN
  Administered 2013-08-03 – 2013-08-05 (×6): 2 mg via INTRAVENOUS
  Filled 2013-08-03 (×7): qty 1

## 2013-08-03 MED ORDER — SODIUM CHLORIDE 0.9 % IJ SOLN
3.0000 mL | Freq: Two times a day (BID) | INTRAMUSCULAR | Status: DC
  Administered 2013-08-03 – 2013-08-04 (×3): 3 mL via INTRAVENOUS
  Administered 2013-08-05: 6 mL via INTRAVENOUS

## 2013-08-03 MED ORDER — CARVEDILOL 3.125 MG PO TABS
3.1250 mg | ORAL_TABLET | Freq: Two times a day (BID) | ORAL | Status: DC
  Administered 2013-08-03: 3.125 mg via ORAL
  Filled 2013-08-03 (×5): qty 1

## 2013-08-03 MED ORDER — SODIUM CHLORIDE 0.9 % IJ SOLN: 3.0000 mL | INTRAMUSCULAR | Status: DC | PRN

## 2013-08-03 MED ORDER — FUROSEMIDE 10 MG/ML IJ SOLN
80.0000 mg | Freq: Once | INTRAMUSCULAR | Status: AC
  Administered 2013-08-03: 80 mg via INTRAVENOUS
  Filled 2013-08-03: qty 8

## 2013-08-03 MED ORDER — SODIUM CHLORIDE 0.9 % IV SOLN: 250.0000 mL | INTRAVENOUS | Status: DC | PRN

## 2013-08-03 MED ORDER — ZOLPIDEM TARTRATE 5 MG PO TABS: 5.0000 mg | ORAL_TABLET | Freq: Every evening | ORAL | Status: DC | PRN

## 2013-08-03 MED ORDER — CAPTOPRIL 6.25 MG HALF TABLET
6.2500 mg | ORAL_TABLET | Freq: Three times a day (TID) | ORAL | Status: DC
  Administered 2013-08-03 (×2): 6.25 mg via ORAL
  Filled 2013-08-03 (×6): qty 1

## 2013-08-03 MED ORDER — ONDANSETRON HCL 4 MG/2ML IJ SOLN: 4.0000 mg | Freq: Four times a day (QID) | INTRAMUSCULAR | Status: DC | PRN

## 2013-08-03 MED ORDER — FUROSEMIDE 10 MG/ML IJ SOLN
80.0000 mg | Freq: Two times a day (BID) | INTRAMUSCULAR | Status: DC
  Administered 2013-08-03: 80 mg via INTRAVENOUS
  Filled 2013-08-03 (×3): qty 8

## 2013-08-03 MED ORDER — POTASSIUM CHLORIDE CRYS ER 20 MEQ PO TBCR
40.0000 meq | EXTENDED_RELEASE_TABLET | Freq: Every day | ORAL | Status: DC
  Administered 2013-08-03 – 2013-08-04 (×2): 40 meq via ORAL
  Filled 2013-08-03 (×3): qty 2

## 2013-08-03 MED ORDER — HEPARIN SODIUM (PORCINE) 5000 UNIT/ML IJ SOLN
5000.0000 [IU] | Freq: Three times a day (TID) | INTRAMUSCULAR | Status: DC
  Administered 2013-08-03 – 2013-08-06 (×9): 5000 [IU] via SUBCUTANEOUS
  Filled 2013-08-03 (×12): qty 1

## 2013-08-03 MED ORDER — ACETAMINOPHEN 325 MG PO TABS: 650.0000 mg | ORAL_TABLET | ORAL | Status: DC | PRN

## 2013-08-03 NOTE — Progress Notes (Signed)
Monitoring of  po fluid intake discussed w/Pt . Dr Tenny Craw of b/p's .

## 2013-08-03 NOTE — ED Notes (Signed)
Patient states he started "swelling up all over on Saturday".   Patient states he had been doing good after his ileostomy reversal 2 years ago, but Saturday his stomach started swelling and "feels like a stone in there".  Patient distended.   Firm lower abdomen.  Soft upper abdomen.   Patient states his feet started swelling also.   Patient states when he swelled, it made it hard to breath at times.

## 2013-08-03 NOTE — H&P (Signed)
Patient ID: Jermaine Herring MRN: [id number], DOB/AGE: [birth year]   Admit date: 08/03/2013  Primary Physician: Default, Provider, MD Primary Cardiologist: New - Dr. Tenny Craw  Pt. Profile:  42 y/o male without prior cardiac hx who presented to the ED today with a 4 day h/o progressive dyspnea and volume overload.  Problem List  Past Medical History  Diagnosis Date  . Crohn disease     a. Followed by Dr. Greggory Brandy, [facility].  Marland Kitchen Obesity   . Diverticulitis     a. with h/o perforation and multiple abscesses req loop ileostomy, sigmoid colectomy, and SB resectionx 2 01/2010.  . Tobacco abuse   . History of substance abuse     a. previously drank heavily on weekends and used marijuana daily x 20+ yrs.  Quit both 07/05/2013.    Past Surgical History  Procedure Laterality Date  . Colon surgery    . Ileostomy reversal      Allergies  Allergies  Allergen Reactions  . Mushroom Extract Complex Anaphylaxis   HPI  42 y/o male without prior cardiac hx.  He does have a h/o crohn's dzs and diverticulitis s/p GI interventions in 2011.  He was placed on Remicade @ that time and was following with [facility] locally.  He then moved to [place], [place] about a year and a 1/2 ago.  Since his move, he has not followed up with GI or primary care.  He has been off of all of his medications.  He continued to smoke cigarettes and marijuana on a daily basis and was also drinking heavily on weekends.    August 4th this year marked the 1 yr anniversary of his mother's death and he decided that he was going to try and get himself in shape.  He quit drinking and smoking pot and cut back his cigarette usage to 4 cigs/day.  He switched to an organic diet and started going to the gym.  He noted weight loss and feeling better.  He says that 2 wks ago, he was down to 290 lbs.   He came to [place] about a week ago to clean out his mother's home.  He began to note that he was having some swelling in his legs and he also noted loss  of appetite, saying that he would eat a few bites of a piece of fruit and then feel full.  On Saturday morning, 8/30, he awoke and noted dyspnea as soon as he sat up in bed.  He also noted significant abdominal bloating.  Since 8/30, early satiety, dyspnea, lower ext edema, and abdominal bloating have worsened.  He's also had significant orthopnea.  He has not had any chest pain.  He has not had any recent viral infections.  As a result of progressive Ss, he presented to the ED this AM.  Here, he is tachycardic and CXR shows pulm edema.  We've been asked to eval.  He's been given 80mg  of IV lasix and so far has diuresed roughly 3 liters.  He remains dyspneic/orthopneic.  Home Medications  None  Family History  Family History  Problem Relation Age of Onset  . Cancer Mother    Social History  History   Social History  . Marital Status: Single    Spouse Name: N/A    Number of Children: N/A  . Years of Education: N/A   Occupational History  . Not on file.   Social History Main Topics  . Smoking status: Current Every  Day Smoker -- 0.25 packs/day    Types: Cigarettes  . Smokeless tobacco: Not on file     Comment: previously smoked 1ppd x 20+ yrs, now smoking 4 cigs/day (08/2013).  Marland Kitchen Alcohol Use: Yes     Comment: previously drank heavily on weekends.  Quit all ETOH on 07/05/2013.  . Drug Use: Yes    Special: Marijuana     Comment: previously smoked marijuana daily x 20+ yrs.  Quit 07/05/2013.  Marland Kitchen Sexual Activity: Not on file   Other Topics Concern  . Not on file   Social History Narrative   Pt lives in [place], [place] by himself.  He is currently in [place] x ~1 wk cleaning out his mother's house and prepping it for sale.  He started exercising about 1 month ago @ which time he quit etoh and marijuana.      Review of Systems General:  No chills, fever, night sweats or weight changes.  Cardiovascular:  No chest pain, +++ dyspnea on exertion, edema, orthopnea, paroxysmal nocturnal dyspnea,  and early satiety.  No palpitations. Dermatological: No rash, lesions/masses Respiratory: No cough, +++ dyspnea Urologic: No hematuria, dysuria Abdominal:   +++ nausea, vomiting, and early satiety.  No diarrhea, bright red blood per rectum, melena, or hematemesis Neurologic:  No visual changes, wkns, changes in mental status. All other systems reviewed and are otherwise negative except as noted above.  Physical Exam  Blood pressure 156/100, pulse 111, temperature 98.1 F (36.7 C), temperature source Oral, resp. rate 22, height 5\' 10"  (1.778 m), weight 290 lb (131.543 kg), SpO2 97.00%.  General: Pleasant, NAD Psych: Normal affect. Neuro: Alert and oriented X 3. Moves all extremities spontaneously. HEENT: Normal  Neck: Supple without bruits.  Neck is obese but JVP noticeable to jaw. Lungs:  Resp regular and unlabored, basilar crackles, insp wheezing. Heart: RRR, tachy, +S3, no murmurs. Abdomen: soft, protuberant, diffusely tender, BS + x 4.  Extremities: No clubbing, cyanosis.  2+ bilat LE edema to knees. DP/PT/Radials 2+ and equal bilaterally.  Labs  Trop i, poc: 0.04  Lab Results  Component Value Date   WBC 3.9* 08/03/2013   HGB 11.9* 08/03/2013   HCT 35.9* 08/03/2013   MCV 75.9* 08/03/2013   PLT 449* 08/03/2013    Recent Labs Lab 08/03/13 0740  NA 140  K 3.4*  CL 104  CO2 22  BUN 7  CREATININE 1.09  CALCIUM 8.7  PROT 7.4  BILITOT 1.2  ALKPHOS 74  ALT 7  AST 15  GLUCOSE 96   Radiology/Studies  Dg Chest 2 View  08/03/2013   *RADIOLOGY REPORT*  Clinical Data: Shortness of breath, increased blood pressure today, cough, swelling in arms and legs, lower chest pain, past history Crohn's disease, smoking, fluid removal from lungs several years ago post bowel surgery  CHEST - 2 VIEW  Comparison: 07/26/2010  Findings: Enlargement of cardiac silhouette with pulmonary vascular congestion. Perihilar interstitial infiltrates compatible with pulmonary edema and CHF. Small bilateral  pleural effusions with additional fluid within a major fissure on lateral view likely left. No pneumothorax or acute osseous findings.  IMPRESSION: CHF with bilateral pleural effusions greater on left.   Original Report Authenticated By: Ulyses Southward, M.D.   ECG  Sinus tach, 104, lad, poor r prog, no acute st/t changes.  ASSESSMENT AND PLAN  1.  Acute CHF, presumably systolic:  Pt presents today with a 4 day h/o progressive dyspnea, orthopnea, pnd, LEE, and early satiety in the setting of what appears to  be a 54 lb weight gain over the past 2 wks (290 per pt 2 wks ago, 344 in ER today).  He is massively volume overloaded on exam today.  He has an S3.  His BP is elevated and he is tachycardic.  He has received lasix 80mg  IV x 1 here and has already put out about 3 L.  Will continue IV diuresis.  Add acei and low dose bb in setting of hypertension.  Check echo.  If EF confirmed as low, plan cath to r/o obstructive CAD as cause, once he is able to lie flat.  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  No recent viral illnesses.  2.  HTN:  Add acei and low-dose bb.  Follow with diuresis.  Low-threshold to add hydral/ntg and then spiro provided renal fxn/K stable.  3.  Substance abuse:  Tobacco cessation counseling.  He quit etoh and MJ last month.  4.  Crohn's Dzs:  Prev seen by [facility].  Off all meds x 18 mos +.  No current issues.  Signed, Nicolasa Ducking, NP 08/03/2013, 11:11 AM  Patient seen and examined.  I have amended note to reflect my findings.  Patient with marked volume overload.  CXR with severe cardiomegaly WIll continue diuresis.  Add ACE I and b blocker.  Lasix bid Echo to evaluate LV function  Dietrich Pates

## 2013-08-03 NOTE — ED Notes (Signed)
Report received, assumed care.  

## 2013-08-03 NOTE — ED Provider Notes (Signed)
CSN: [id number]     Arrival date & time 08/03/13  5366 History   First MD Initiated Contact with Patient 08/03/13 [phone]     Chief Complaint  Patient presents with  . Abdominal Pain  . Shortness of Breath   (Consider location/radiation/quality/duration/timing/severity/associated sxs/prior Treatment) HPI Comments: Patient is a 42 year old male with a past medical history of Crohn's Disease who presents with SOB for the past 3 days. Symptoms started gradually and progressively worsened since the onset. Patient reports his "whole body is swelling" as he feels like he is retaining fluid in his abdomen and legs. Patient reports associated generalized abdominal pain. No other associated symptoms. No aggravating/alleviating factors. Patient has never had these symptoms previously.   Patient is a 42 y.o. male presenting with abdominal pain and shortness of breath.  Abdominal Pain Associated symptoms: shortness of breath   Shortness of Breath Associated symptoms: abdominal pain     Past Medical History  Diagnosis Date  . Crohn disease   . Obesity    Past Surgical History  Procedure Laterality Date  . Colon surgery    . Ileostomy reversal     Family History  Problem Relation Age of Onset  . Cancer Mother    History  Substance Use Topics  . Smoking status: Current Every Day Smoker -- 0.25 packs/day    Types: Cigarettes  . Smokeless tobacco: Not on file  . Alcohol Use: Yes    Review of Systems  Respiratory: Positive for shortness of breath.   Cardiovascular: Positive for leg swelling.  Gastrointestinal: Positive for abdominal pain.  All other systems reviewed and are negative.    Allergies  Mushroom extract complex  Home Medications  No current outpatient prescriptions on file. BP 152/106  Pulse 107  Temp(Src) 98.1 F (36.7 C) (Oral)  Resp 20  Ht 5\' 10"  (1.778 m)  Wt 290 lb (131.543 kg)  BMI 41.61 kg/m2  SpO2 99% Physical Exam  Nursing note and vitals  reviewed. Constitutional: He is oriented to person, place, and time. He appears well-developed and well-nourished. No distress.  HENT:  Head: Normocephalic and atraumatic.  Eyes: Conjunctivae are normal.  Neck: Normal range of motion.  Cardiovascular: Normal rate and regular rhythm.  Exam reveals no gallop and no friction rub.   No murmur heard. Pulmonary/Chest: Effort normal and breath sounds normal. He has no wheezes. He has no rales. He exhibits no tenderness.  Abdominal: Soft. He exhibits no distension. There is no tenderness. There is no rebound and no guarding.  Generalized mild tenderness to palpation. No peritoneal signs or focal tenderness to palpation.   Musculoskeletal: Normal range of motion.  Lower extremity 2+ pitting edema and tenderness to palpation.   Neurological: He is alert and oriented to person, place, and time. Coordination normal.  Speech is goal-oriented. Moves limbs without ataxia.   Skin: Skin is warm and dry.  Psychiatric: He has a normal mood and affect. His behavior is normal.    ED Course  Procedures (including critical care time)   Date: 08/03/2013  Rate: 104  Rhythm: sinus tachycardia  QRS Axis: left  Intervals: normal  ST/T Wave abnormalities: normal  Conduction Disutrbances:left anterior fascicular block  Narrative Interpretation: NSR without acute changes from previous  Old EKG Reviewed: unchanged   Labs Review Labs Reviewed  CBC WITH DIFFERENTIAL - Abnormal; Notable for the following:    WBC 3.9 (*)    Hemoglobin 11.9 (*)    HCT 35.9 (*)  MCV 75.9 (*)    MCH 25.2 (*)    RDW 20.3 (*)    Platelets 449 (*)    Basophils Relative 2 (*)    All other components within normal limits  COMPREHENSIVE METABOLIC PANEL - Abnormal; Notable for the following:    Potassium 3.4 (*)    Albumin 3.3 (*)    GFR calc non Af Amer 83 (*)    All other components within normal limits  PRO B NATRIURETIC PEPTIDE - Abnormal; Notable for the following:     Pro B Natriuretic peptide (BNP) 1645.0 (*)    All other components within normal limits  URINE CULTURE  LIPASE, BLOOD  URINALYSIS, ROUTINE W REFLEX MICROSCOPIC  POCT I-STAT TROPONIN I   Imaging Review Dg Chest 2 View  08/03/2013   *RADIOLOGY REPORT*  Clinical Data: Shortness of breath, increased blood pressure today, cough, swelling in arms and legs, lower chest pain, past history Crohn's disease, smoking, fluid removal from lungs several years ago post bowel surgery  CHEST - 2 VIEW  Comparison: 07/26/2010  Findings: Enlargement of cardiac silhouette with pulmonary vascular congestion. Perihilar interstitial infiltrates compatible with pulmonary edema and CHF. Small bilateral pleural effusions with additional fluid within a major fissure on lateral view likely left. No pneumothorax or acute osseous findings.  IMPRESSION: CHF with bilateral pleural effusions greater on left.   Original Report Authenticated By: Ulyses Southward, M.D.    MDM   1. CHF (congestive heart failure)     7:29 AM Labs pending. Patient is tachycardic and afebrile.   9:42 AM Patient has an elevated BNP and a chest xray consistent with CHF. I will consult Cardiology for admission.   Emilia Beck, PA-C 08/03/13 1534

## 2013-08-03 NOTE — Care Management Note (Addendum)
    Page 1 of 2   08/05/2013     1:57:38 PM   CARE MANAGEMENT NOTE 08/05/2013  Patient:  Jermaine Herring, Jermaine Herring   Account Number:  [id number]  Date Initiated:  08/03/2013  Documentation initiated by:  Junius Creamer  Subjective/Objective Assessment:   adm w volume overload     Action/Plan:   lives alone   Anticipated DC Date:     Anticipated DC Plan:  HOME W HOME HEALTH SERVICES      DC Planning Services  CM consult      [facility]  HOME HEALTH   Choice offered to / List presented to:  C-1 Patient        HH arranged  HH-1 RN  HH-10 DISEASE MANAGEMENT      HH agency  [facility]   Status of service:   Medicare Important Message given?   (If response is "NO", the following Medicare IM given date fields will be blank) Date Medicare IM given:   Date Additional Medicare IM given:    Discharge Disposition:  HOME W HOME HEALTH SERVICES  Per UR Regulation:  Reviewed for med. necessity/level of care/duration of stay  If discussed at Long Length of Stay Meetings, dates discussed:    Comments:  9/4  1353 debbie Jebediah Macrae rn,bsn spoke w pt. he would like [facility] again, he used them several years ago and would like them again. he says this fluid is new and would like nse for follow up teaching. ref to donna w [facility] for hhrn.  9/2 1552 debbie Sherena Machorro rn,bsn will moniter for [facility] needs as pt progresses.

## 2013-08-03 NOTE — ED Notes (Signed)
Patient transported to X-ray 

## 2013-08-03 NOTE — Progress Notes (Signed)
Utilization review completed.  P.J. Mikhail Hallenbeck,RN,BSN Case Manager [phone]  

## 2013-08-03 NOTE — ED Notes (Signed)
Admitting MD, Adolph Pollack cardiologist at bedside.

## 2013-08-03 NOTE — Plan of Care (Signed)
Problem: Consults Goal: Heart Failure Patient Education (See Patient Education module for education specifics.) Outcome: Progressing Stated he feels to tired to watch video at this time . Brief info re heart failure provided .

## 2013-08-04 DIAGNOSIS — I369 Nonrheumatic tricuspid valve disorder, unspecified: Secondary | ICD-10-CM

## 2013-08-04 LAB — BASIC METABOLIC PANEL
CO2: 26 mEq/L (ref 19–32)
Chloride: 100 mEq/L (ref 96–112)
Chloride: 101 mEq/L (ref 96–112)
Creatinine, Ser: 1.13 mg/dL (ref 0.50–1.35)
Creatinine, Ser: 1.12 mg/dL (ref 0.50–1.35)
GFR calc Af Amer: >90 mL/min (ref >90)
GFR calc non Af Amer: 81 mL/min — ABNORMAL LOW (ref >90)
Glucose, Bld: 94 mg/dL (ref 70–99)
Potassium: 3.7 mEq/L (ref 3.5–5.1)

## 2013-08-04 LAB — URINE CULTURE: Colony Count: NO GROWTH

## 2013-08-04 MED ORDER — FUROSEMIDE 10 MG/ML IJ SOLN
40.0000 mg | Freq: Two times a day (BID) | INTRAMUSCULAR | Status: DC
  Administered 2013-08-04 – 2013-08-05 (×2): 40 mg via INTRAVENOUS
  Filled 2013-08-04 (×2): qty 4

## 2013-08-04 MED ORDER — POTASSIUM CHLORIDE CRYS ER 20 MEQ PO TBCR
40.0000 meq | EXTENDED_RELEASE_TABLET | Freq: Two times a day (BID) | ORAL | Status: DC
  Administered 2013-08-04 – 2013-08-06 (×4): 40 meq via ORAL
  Filled 2013-08-04 (×4): qty 2

## 2013-08-04 MED ORDER — CAPTOPRIL 12.5 MG PO TABS
12.5000 mg | ORAL_TABLET | Freq: Three times a day (TID) | ORAL | Status: DC
  Administered 2013-08-04 (×3): 12.5 mg via ORAL
  Filled 2013-08-04 (×6): qty 1

## 2013-08-04 MED ORDER — CARVEDILOL 6.25 MG PO TABS
6.2500 mg | ORAL_TABLET | Freq: Two times a day (BID) | ORAL | Status: DC
Start: 2013-08-04 — End: 2013-08-06
  Administered 2013-08-04 – 2013-08-06 (×3): 6.25 mg via ORAL
  Filled 2013-08-04 (×6): qty 1

## 2013-08-04 NOTE — Progress Notes (Signed)
Pt SpO2 would drop to the low 90s high 80s. Assessed pt and asked it the pt would like O2 per Nasal cannula. Pt refused and stated "felt fine" breathing without [place]. Will continue to monitor.

## 2013-08-04 NOTE — Progress Notes (Signed)
   SUBJECTIVE: 42 y/o male likely with undiagnosed (untreated) hypertension and excessive ETOH use who presented with dyspnea and massive fluid overload. He was started on Lasix 80 mg iv bid. He is negative 10 L with 7 lbs weight loss overnight. He complains of leg cramps and had NSVT. He is feeling better overall.    Filed Vitals:   08/04/13 0400 08/04/13 0500 08/04/13 0723 08/04/13 0800  BP: 139/81  154/107 157/119  Pulse: 92  93 94  Temp:   98.1 F (36.7 C)   TempSrc:   Oral   Resp: 25  17 23   Height:      Weight:  145.9 kg (321 lb 10.4 oz)    SpO2: 91%  95% 90%    Intake/Output Summary (Last 24 hours) at 08/04/13 0815 Last data filed at 08/04/13 0600  Gross per 24 hour  Intake   1500 ml  Output  16109 ml  Net -10125 ml    LABS: Basic Metabolic Panel:  Recent Labs  60/45/40 0740 08/03/13 1726 08/04/13 0400  NA 140  --  138  K 3.4*  --  3.3*  CL 104  --  100  CO2 22  --  26  GLUCOSE 96  --  94  BUN 7  --  8  CREATININE 1.09  --  1.13  CALCIUM 8.7  --  8.5  MG  --  1.7  --    Liver Function Tests:  Recent Labs  08/03/13 0740  AST 15  ALT 7  ALKPHOS 74  BILITOT 1.2  PROT 7.4  ALBUMIN 3.3*    Recent Labs  08/03/13 0740  LIPASE 58   CBC:  Recent Labs  08/03/13 0740  WBC 3.9*  NEUTROABS 2.4  HGB 11.9*  HCT 35.9*  MCV 75.9*  PLT 449*   Cardiac Enzymes:  Recent Labs  08/03/13 1726 08/03/13 2240 08/04/13 0400  TROPONINI <0.30 <0.30 <0.30     Recent Labs  08/03/13 1726  TSH 3.130   Anemia Panel: No results found for this basename: VITAMINB12, FOLATE, FERRITIN, TIBC, IRON, RETICCTPCT,  in the last 72 hours   PHYSICAL EXAM General: Well developed, well nourished, in no acute distress HEENT:  Normocephalic and atramatic Neck:  mild JVD. Small cervical lymph nodes.  Lungs: Clear bilaterally to auscultation and percussion. Heart: HRRR . Normal S1 and S2 without gallops or murmurs.  Abdomen: Bowel sounds are positive, abdomen soft  and non-tender  Msk:  Back normal, normal gait. Normal strength and tone for age. Extremities: No clubbing, cyanosis . Mild leg edema Neuro: Alert and oriented X 3. Psych:  Good affect, responds appropriately  TELEMETRY: Reviewed telemetry pt in NSR.  Short NSVT.   ASSESSMENT AND PLAN:  1. Acute (likely systolic) heart failure: most likely hypertensive heart disease or ETOH induced (less likely to be ischemic).  Echo today.  Decrease Lasix to 40 mg iv bid.  Increase Coreg and Captopril.  If EF is low, will need a right and left cardiac cath once volume overload is improved.   2. HTN: uncontrolled: increase meds.  3. NSVT: replace K. Increase Coreg.  4. Right cervical lymphadenopathy: monitor. If no improvement, consider [place].    Lorine Bears, MD, [facility] 08/04/2013 8:15 AM

## 2013-08-04 NOTE — Progress Notes (Signed)
  Echocardiogram 2D Echocardiogram has been performed.  [place]ian Co 08/04/2013, 12:18 PM

## 2013-08-04 NOTE — Progress Notes (Signed)
Patient complained of left great toe pain and requesting pain medication; offered patient tylenol at this time, morphine no due for over another hour; patient refused said he would just wait

## 2013-08-04 NOTE — Progress Notes (Signed)
Knot noted by left jaw, with pain when pressed, MD aware. To continue to monitor.

## 2013-08-04 NOTE — Progress Notes (Signed)
Pt stated that jaw felt "funny" asked me to assess. When assessing noticed a large hard bump on left jaw. Smaller less noticeable bump on the right jaw. Pt states not pain from the spot. Will inform physician to assess.

## 2013-08-04 NOTE — ED Provider Notes (Signed)
Medical screening examination/treatment/procedure(s) were conducted as a shared visit with non-physician practitioner(s) and myself.  I personally evaluated the patient during the encounter  The patient presented to the ER for evaluation of shortness of breath. Patient was in no distress, oxygenation was adequate. His workup, however, reveals evidence for acute congestive heart failure, which is a new diagnosis for the patient. The patient requires hospitalization for further workup and management.  Gilda Crease, MD 08/04/13 [phone]

## 2013-08-05 ENCOUNTER — Encounter (HOSPITAL_COMMUNITY)
Admission: EM | Disposition: A | Discharge status: Home / Self Care | Payer: Self-pay | Source: Home / Self Care | Attending: Internal Medicine

## 2013-08-05 ENCOUNTER — Inpatient Hospital Stay (HOSPITAL_COMMUNITY): Payer: Medicaid Other

## 2013-08-05 DIAGNOSIS — I10 Essential (primary) hypertension: Secondary | ICD-10-CM

## 2013-08-05 DIAGNOSIS — I509 Heart failure, unspecified: Secondary | ICD-10-CM

## 2013-08-05 DIAGNOSIS — I428 Other cardiomyopathies: Secondary | ICD-10-CM

## 2013-08-05 HISTORY — PX: LEFT AND RIGHT HEART CATHETERIZATION WITH CORONARY ANGIOGRAM: SHX5449

## 2013-08-05 LAB — BASIC METABOLIC PANEL
CO2: 24 mEq/L (ref 19–32)
Calcium: 8.7 mg/dL (ref 8.4–10.5)
Creatinine, Ser: 1.14 mg/dL (ref 0.50–1.35)
GFR calc Af Amer: >90 mL/min (ref >90)
Sodium: 134 mEq/L — ABNORMAL LOW (ref 135–145)

## 2013-08-05 LAB — POCT I-STAT 3, VENOUS BLOOD GAS (G3P V)
Bicarbonate: 27 mEq/L — ABNORMAL HIGH (ref 20.0–24.0)
O2 Saturation: 40 %
TCO2: 28 mmol/L (ref 0–100)
pCO2, Ven: 42.5 mmHg — ABNORMAL LOW (ref 45.0–50.0)
pH, Ven: 7.41 — ABNORMAL HIGH (ref 7.250–7.300)
pO2, Ven: 24 mmHg — CL (ref 30.0–45.0)

## 2013-08-05 LAB — POCT I-STAT 3, ART BLOOD GAS (G3+)
Acid-Base Excess: 3 mmol/L — ABNORMAL HIGH (ref 0.0–2.0)
O2 Saturation: 98 %
TCO2: 27 mmol/L (ref 0–100)
pCO2 arterial: 35.3 mmHg (ref 35.0–45.0)
pCO2 arterial: 37.3 mmHg (ref 35.0–45.0)
pH, Arterial: 7.452 — ABNORMAL HIGH (ref 7.350–7.450)
pO2, Arterial: 101 mmHg — ABNORMAL HIGH (ref 80.0–100.0)

## 2013-08-05 LAB — GLUCOSE, CAPILLARY: Glucose-Capillary: 89 mg/dL (ref 70–99)

## 2013-08-05 LAB — PROTIME-INR: Prothrombin Time: 14.4 seconds (ref 11.6–15.2)

## 2013-08-05 SURGERY — LEFT AND RIGHT HEART CATHETERIZATION WITH CORONARY ANGIOGRAM
Anesthesia: LOCAL

## 2013-08-05 MED ORDER — LISINOPRIL 10 MG PO TABS
10.0000 mg | ORAL_TABLET | Freq: Every day | ORAL | Status: DC
  Administered 2013-08-05 – 2013-08-06 (×2): 10 mg via ORAL
  Filled 2013-08-05 (×2): qty 1

## 2013-08-05 MED ORDER — NITROGLYCERIN 0.2 MG/ML ON CALL CATH LAB
INTRAVENOUS | Status: AC
  Filled 2013-08-05: qty 1

## 2013-08-05 MED ORDER — FENTANYL CITRATE 0.05 MG/ML IJ SOLN
INTRAMUSCULAR | Status: AC
  Filled 2013-08-05: qty 2

## 2013-08-05 MED ORDER — ASPIRIN 81 MG PO CHEW
CHEWABLE_TABLET | ORAL | Status: AC
  Administered 2013-08-05: 324 mg via ORAL
  Filled 2013-08-05: qty 4

## 2013-08-05 MED ORDER — SPIRONOLACTONE 12.5 MG HALF TABLET
12.5000 mg | ORAL_TABLET | Freq: Every day | ORAL | Status: DC
  Administered 2013-08-05 – 2013-08-06 (×2): 12.5 mg via ORAL
  Filled 2013-08-05 (×2): qty 1

## 2013-08-05 MED ORDER — NICOTINE 14 MG/24HR TD PT24
14.0000 mg | MEDICATED_PATCH | Freq: Every day | TRANSDERMAL | Status: DC | PRN
  Administered 2013-08-06: 14 mg via TRANSDERMAL
  Filled 2013-08-05: qty 1

## 2013-08-05 MED ORDER — LIDOCAINE HCL (PF) 1 % IJ SOLN
INTRAMUSCULAR | Status: AC
  Filled 2013-08-05: qty 30

## 2013-08-05 MED ORDER — VERAPAMIL HCL 2.5 MG/ML IV SOLN
INTRAVENOUS | Status: AC
  Filled 2013-08-05: qty 2

## 2013-08-05 MED ORDER — HEPARIN (PORCINE) IN NACL 2-0.9 UNIT/ML-% IJ SOLN
INTRAMUSCULAR | Status: AC
  Filled 2013-08-05: qty 1000

## 2013-08-05 MED ORDER — ASPIRIN 81 MG PO CHEW
324.0000 mg | CHEWABLE_TABLET | ORAL | Status: AC
  Administered 2013-08-05: 324 mg via ORAL

## 2013-08-05 MED ORDER — MIDAZOLAM HCL 2 MG/2ML IJ SOLN
INTRAMUSCULAR | Status: AC
  Filled 2013-08-05: qty 2

## 2013-08-05 MED ORDER — SODIUM CHLORIDE 0.9 % IJ SOLN: 3.0000 mL | INTRAMUSCULAR | Status: DC | PRN

## 2013-08-05 MED ORDER — HEPARIN SODIUM (PORCINE) 1000 UNIT/ML IJ SOLN
INTRAMUSCULAR | Status: AC
  Filled 2013-08-05: qty 1

## 2013-08-05 MED ORDER — SODIUM CHLORIDE 0.9 % IJ SOLN: 3.0000 mL | Freq: Two times a day (BID) | INTRAMUSCULAR | Status: DC

## 2013-08-05 MED ORDER — COLCHICINE 0.6 MG PO TABS
0.6000 mg | ORAL_TABLET | Freq: Two times a day (BID) | ORAL | Status: DC
  Administered 2013-08-05 – 2013-08-06 (×2): 0.6 mg via ORAL
  Filled 2013-08-05 (×3): qty 1

## 2013-08-05 MED ORDER — OXYCODONE-ACETAMINOPHEN 5-325 MG PO TABS
1.0000 | ORAL_TABLET | ORAL | Status: DC | PRN
  Administered 2013-08-05: 1 via ORAL
  Filled 2013-08-05: qty 1

## 2013-08-05 MED ORDER — FUROSEMIDE 10 MG/ML IJ SOLN
80.0000 mg | Freq: Two times a day (BID) | INTRAMUSCULAR | Status: DC
  Administered 2013-08-06: 80 mg via INTRAVENOUS
  Filled 2013-08-05 (×2): qty 8

## 2013-08-05 MED ORDER — SODIUM CHLORIDE 0.9 % IV SOLN: 250.0000 mL | INTRAVENOUS | Status: DC | PRN

## 2013-08-05 NOTE — CV Procedure (Signed)
   Cardiac Catheterization Procedure Note  Name: Jermaine Herring MRN: [id number] DOB: 1971-07-05  Procedure: Right Heart Cath, Left Heart Cath, Selective Coronary Angiography, LV angiography  Indication: CHF, severe cardiomyopathy   Procedural Details: The right wrist was prepped, draped, and anesthetized with 1% lidocaine. Using the modified Seldinger technique a 5 French sheath was placed in the right radial artery and a 5 French sheath was placed in the right antecubital vein. A 5 Fr Swan-Ganz catheter was used for the right heart catheterization. Standard protocol was followed for recording of right heart pressures and sampling of oxygen saturations. Fick cardiac output was calculated. Standard Judkins catheters were used for selective coronary angiography. There were no immediate procedural complications. The patient was transferred to the post catheterization recovery area for further monitoring.  Procedural Findings: Hemodynamics RA 21 RV 55/22 PA 52/30 mean 37 PCWP 29 LV 116/29 AO 115/89  Oxygen saturations: PA 57 AO 97  Cardiac Output (Fick) 5.2  Cardiac Index (Fick) 2.0   Coronary angiography: Coronary dominance: right  Left mainstem: Widely patent, normal vessel  Left anterior descending (LAD): Patent to the LV apex, patent diagonal  Left circumflex (LCx): supplies large OM without significant disease  Right coronary artery (RCA): dominant vessel, large in caliber. Minor irregularity in the mid-RCA but no significant stenosis  Left ventriculography: deferred  Final Conclusions:   1. Patent coronaries with mild irregularities 2. CHF with elevated filling pressures  Recommendations: continue med Rx for CHF and IV diuresis with furosemide.   Tonny Bollman 08/05/2013, 6:59 PM

## 2013-08-05 NOTE — Progress Notes (Signed)
Subjective: Deneis CP  No SOB at rest Objective: Filed Vitals:   08/05/13 0000 08/05/13 0400 08/05/13 0500 08/05/13 0710  BP: 132/94 142/112  144/120  Pulse: 87 92  94  Temp: 98.4 F (36.9 C) 98.4 F (36.9 C)    TempSrc: Oral Oral    Resp: 24 23  21   Height:      Weight:   317 lb 3.9 oz (143.9 kg)   SpO2: 93% 89%  96%   Weight change: -25 lb 12.1 oz (-11.684 kg)  Intake/Output Summary (Last 24 hours) at 08/05/13 0803 Last data filed at 08/05/13 0700  Gross per 24 hour  Intake    780 ml  Output   2875 ml  Net  -2095 ml   Total I/O  -12.2 L  General: Alert, awake, oriented x3, in no acute distress Neck:  JVP is increased at 11  L jaw  No signif lymph nodes Heart: Regular rate and rhythm, without murmurs, rubs, gallops.  Lungs: Clear to auscultation.  No rales or wheezes. Exemities:  1+ edema.  L great toe tender  Not red  Stiff   Neuro: Grossly intact, nonfocal.  Tele:  SR Lab Results: Results for orders placed during the hospital encounter of 08/03/13 (from the past 24 hour(s))  BASIC METABOLIC PANEL     Status: Abnormal   Collection Time    08/05/13  4:25 AM      Result Value Range   Sodium 134 (*) 135 - 145 mEq/L   Potassium 4.1  3.5 - 5.1 mEq/L   Chloride 98  96 - 112 mEq/L   CO2 24  19 - 32 mEq/L   Glucose, Bld 93  70 - 99 mg/dL   BUN 10  6 - 23 mg/dL   Creatinine, Ser 1.61  0.50 - 1.35 mg/dL   Calcium 8.7  8.4 - 09.6 mg/dL   GFR calc non Af Amer 79 (*) >90 mL/min   GFR calc Af Amer >90  >90 mL/min    Studies/Results: Echo:  08/04/13  - Left ventricle: The cavity size was severely dilated. Wall thickness was normal. Systolic function was severely reduced. The estimated ejection fraction was in the range of 20% to 25%. Diffuse hypokinesis. - Left atrium: The atrium was moderately to severely dilated. - Right ventricle: The cavity size was moderately dilated. Systolic function was moderately reduced. - Right atrium: The atrium was moderately dilated. -  Tricuspid valve: Moderate regurgitation. - Pulmonary arteries: Systolic pressure was mildly to moderately increased. PA peak pressure: 48mm Hg (S). Echocardiography. M-mode, complete 2D, spectral Doppler, and color Doppler. Height: Height: 177.8cm. Height: 70in. Weight: Weight: 145.6kg. Weight: 320.3lb. Body mass index: BMI: 46.1kg/m^2. Body surface area: BSA: 2.38m^2. Blood pressure: 159/116. Patient status: Inpatient. Location: ICU/CCU  ------------------------------------------------------------  ------------------------------------------------------------ Left ventricle: The cavity size was severely dilated. Wall thickness was normal. Systolic function was severely reduced. The estimated ejection fraction was in the range of 20% to 25%. Diffuse hypokinesis. Early diastolic septal annular tissue Doppler velocities Ea were abnormal.  ------------------------------------------------------------ Aortic valve: Structurally normal valve. Cusp separation was normal. Doppler: Transvalvular velocity was within the normal range. There was no stenosis. No regurgitation.  ------------------------------------------------------------ Aorta: Aortic root: The aortic root was normal in size. Ascending aorta: The ascending aorta was mildly dilated.  ------------------------------------------------------------ Mitral valve: Structurally normal valve. Leaflet separation was normal. Doppler: Transvalvular velocity was within the normal range. There was no evidence for stenosis. No regurgitation. Peak gradient: 5mm Hg (D).  ------------------------------------------------------------ Left  atrium: The atrium was moderately to severely dilated.  ------------------------------------------------------------ Right ventricle: The cavity size was moderately dilated. Systolic function was moderately reduced.  ------------------------------------------------------------ Tricuspid valve: Structurally  normal valve. Doppler: Moderate regurgitation.  ------------------------------------------------------------ Pulmonary artery: Systolic pressure was mildly to moderately increased.  ------------------------------------------------------------ Right atrium: The atrium was moderately dilated.  ------------------------------------------------------------ Pericardium: There was no pericardial effusion   Medications: Reviewed   @PROBHOSP @  1.  Acute systolic CHF  Patient with biventricular systolic CHF  Diuresing well  I would increase lasix to 80 bid  Would switch captopirl to lisinopril for once daily dosing.  Add aldactone. I would recomm R and L heart cath to define anatomy  Patient says he has to leave because of legal problems with moms estate  Will defer cath for today.  See if get extension.  2.  HTN  Improving    3.  NSVT  4.  SOcial  Will write letter to get extension  On legal matters.  Encouraged him to stay.  LOS: 2 days   Dietrich Pates 08/05/2013, 8:03 AM

## 2013-08-05 NOTE — Interval H&P Note (Signed)
History and Physical Interval Note:  08/05/2013 5:59 PM  Jermaine Herring  has presented today for surgery, with the diagnosis of cp  The various methods of treatment have been discussed with the patient and family. After consideration of risks, benefits and other options for treatment, the patient has consented to  Procedure(s): LEFT AND RIGHT HEART CATHETERIZATION WITH CORONARY ANGIOGRAM (N/A) as a surgical intervention .  The patient's history has been reviewed, patient examined, no change in status, stable for surgery.  I have reviewed the patient's chart and labs.  Questions were answered to the patient's satisfaction.     Cath Lab Visit (complete for each Cath Lab visit)  Clinical Evaluation Leading to the Procedure:   ACS: no  Non-ACS:    Anginal Classification: No Symptoms  Anti-ischemic medical therapy: Minimal Therapy (1 class of medications)  Non-Invasive Test Results: No non-invasive testing performed  Prior CABG: No previous CABG       Tonny Bollman

## 2013-08-05 NOTE — H&P (View-Only) (Signed)
Subjective: Deneis CP  No SOB at rest Objective: Filed Vitals:   08/05/13 0000 08/05/13 0400 08/05/13 0500 08/05/13 0710  BP: 132/94 142/112  144/120  Pulse: 87 92  94  Temp: 98.4 F (36.9 C) 98.4 F (36.9 C)    TempSrc: Oral Oral    Resp: 24 23  21  Height:      Weight:   317 lb 3.9 oz (143.9 kg)   SpO2: 93% 89%  96%   Weight change: -25 lb 12.1 oz (-11.684 kg)  Intake/Output Summary (Last 24 hours) at 08/05/13 0803 Last data filed at 08/05/13 0700  Gross per 24 hour  Intake    780 ml  Output   2875 ml  Net  -2095 ml   Total I/O  -12.2 L  General: Alert, awake, oriented x3, in no acute distress Neck:  JVP is increased at 11  L jaw  No signif lymph nodes Heart: Regular rate and rhythm, without murmurs, rubs, gallops.  Lungs: Clear to auscultation.  No rales or wheezes. Exemities:  1+ edema.  L great toe tender  Not red  Stiff   Neuro: Grossly intact, nonfocal.  Tele:  SR Lab Results: Results for orders placed during the hospital encounter of 08/03/13 (from the past 24 hour(s))  BASIC METABOLIC PANEL     Status: Abnormal   Collection Time    08/05/13  4:25 AM      Result Value Range   Sodium 134 (*) 135 - 145 mEq/L   Potassium 4.1  3.5 - 5.1 mEq/L   Chloride 98  96 - 112 mEq/L   CO2 24  19 - 32 mEq/L   Glucose, Bld 93  70 - 99 mg/dL   BUN 10  6 - 23 mg/dL   Creatinine, Ser 1.14  0.50 - 1.35 mg/dL   Calcium 8.7  8.4 - 10.5 mg/dL   GFR calc non Af Amer 79 (*) >90 mL/min   GFR calc Af Amer >90  >90 mL/min    Studies/Results: Echo:  08/04/13  - Left ventricle: The cavity size was severely dilated. Wall thickness was normal. Systolic function was severely reduced. The estimated ejection fraction was in the range of 20% to 25%. Diffuse hypokinesis. - Left atrium: The atrium was moderately to severely dilated. - Right ventricle: The cavity size was moderately dilated. Systolic function was moderately reduced. - Right atrium: The atrium was moderately dilated. -  Tricuspid valve: Moderate regurgitation. - Pulmonary arteries: Systolic pressure was mildly to moderately increased. PA peak pressure: 48mm Hg (S). Echocardiography. M-mode, complete 2D, spectral Doppler, and color Doppler. Height: Height: 177.8cm. Height: 70in. Weight: Weight: 145.6kg. Weight: 320.3lb. Body mass index: BMI: 46.1kg/m^2. Body surface area: BSA: 2.55m^2. Blood pressure: 159/116. Patient status: Inpatient. Location: ICU/CCU  ------------------------------------------------------------  ------------------------------------------------------------ Left ventricle: The cavity size was severely dilated. Wall thickness was normal. Systolic function was severely reduced. The estimated ejection fraction was in the range of 20% to 25%. Diffuse hypokinesis. Early diastolic septal annular tissue Doppler velocities Ea were abnormal.  ------------------------------------------------------------ Aortic valve: Structurally normal valve. Cusp separation was normal. Doppler: Transvalvular velocity was within the normal range. There was no stenosis. No regurgitation.  ------------------------------------------------------------ Aorta: Aortic root: The aortic root was normal in size. Ascending aorta: The ascending aorta was mildly dilated.  ------------------------------------------------------------ Mitral valve: Structurally normal valve. Leaflet separation was normal. Doppler: Transvalvular velocity was within the normal range. There was no evidence for stenosis. No regurgitation. Peak gradient: 5mm Hg (D).  ------------------------------------------------------------ Left   atrium: The atrium was moderately to severely dilated.  ------------------------------------------------------------ Right ventricle: The cavity size was moderately dilated. Systolic function was moderately reduced.  ------------------------------------------------------------ Tricuspid valve: Structurally  normal valve. Doppler: Moderate regurgitation.  ------------------------------------------------------------ Pulmonary artery: Systolic pressure was mildly to moderately increased.  ------------------------------------------------------------ Right atrium: The atrium was moderately dilated.  ------------------------------------------------------------ Pericardium: There was no pericardial effusion   Medications: Reviewed   @PROBHOSP@  1.  Acute systolic CHF  Patient with biventricular systolic CHF  Diuresing well  I would increase lasix to 80 bid  Would switch captopirl to lisinopril for once daily dosing.  Add aldactone. I would recomm R and L heart cath to define anatomy  Patient says he has to leave because of legal problems with moms estate  Will defer cath for today.  See if get extension.  2.  HTN  Improving    3.  NSVT  4.  SOcial  Will write letter to get extension  On legal matters.  Encouraged him to stay.  LOS: 2 days   Winda Summerall 08/05/2013, 8:03 AM   

## 2013-08-06 ENCOUNTER — Encounter (HOSPITAL_COMMUNITY): Payer: Self-pay | Admitting: Nurse Practitioner

## 2013-08-06 ENCOUNTER — Other Ambulatory Visit: Payer: Self-pay | Admitting: Nurse Practitioner

## 2013-08-06 ENCOUNTER — Telehealth: Payer: Self-pay | Admitting: Nurse Practitioner

## 2013-08-06 DIAGNOSIS — K5792 Diverticulitis of intestine, part unspecified, without perforation or abscess without bleeding: Secondary | ICD-10-CM | POA: Insufficient documentation

## 2013-08-06 DIAGNOSIS — Z720 Tobacco use: Secondary | ICD-10-CM | POA: Diagnosis present

## 2013-08-06 DIAGNOSIS — I472 Ventricular tachycardia: Secondary | ICD-10-CM | POA: Diagnosis present

## 2013-08-06 DIAGNOSIS — I428 Other cardiomyopathies: Secondary | ICD-10-CM | POA: Diagnosis present

## 2013-08-06 DIAGNOSIS — E876 Hypokalemia: Secondary | ICD-10-CM

## 2013-08-06 DIAGNOSIS — D509 Iron deficiency anemia, unspecified: Secondary | ICD-10-CM | POA: Diagnosis present

## 2013-08-06 DIAGNOSIS — E669 Obesity, unspecified: Secondary | ICD-10-CM | POA: Diagnosis present

## 2013-08-06 DIAGNOSIS — I5022 Chronic systolic (congestive) heart failure: Secondary | ICD-10-CM | POA: Insufficient documentation

## 2013-08-06 DIAGNOSIS — K5090 Crohn's disease, unspecified, without complications: Secondary | ICD-10-CM | POA: Diagnosis present

## 2013-08-06 DIAGNOSIS — F1911 Other psychoactive substance abuse, in remission: Secondary | ICD-10-CM | POA: Diagnosis present

## 2013-08-06 DIAGNOSIS — I5021 Acute systolic (congestive) heart failure: Secondary | ICD-10-CM

## 2013-08-06 LAB — BASIC METABOLIC PANEL
BUN: 12 mg/dL (ref 6–23)
Calcium: 9.1 mg/dL (ref 8.4–10.5)
Creatinine, Ser: 1.2 mg/dL (ref 0.50–1.35)
GFR calc non Af Amer: 74 mL/min — ABNORMAL LOW (ref >90)
Glucose, Bld: 94 mg/dL (ref 70–99)
Potassium: 4.5 mEq/L (ref 3.5–5.1)

## 2013-08-06 MED ORDER — LISINOPRIL 10 MG PO TABS: 10.0000 mg | ORAL_TABLET | Freq: Every day | ORAL | Status: DC

## 2013-08-06 MED ORDER — COLCHICINE 0.6 MG PO TABS: 0.6000 mg | ORAL_TABLET | Freq: Every day | ORAL | Status: DC

## 2013-08-06 MED ORDER — SPIRONOLACTONE 25 MG PO TABS: 12.5000 mg | ORAL_TABLET | Freq: Every day | ORAL | Status: DC

## 2013-08-06 MED ORDER — FUROSEMIDE 40 MG PO TABS: 40.0000 mg | ORAL_TABLET | Freq: Two times a day (BID) | ORAL | Status: DC

## 2013-08-06 MED ORDER — CARVEDILOL 6.25 MG PO TABS: 6.2500 mg | ORAL_TABLET | Freq: Two times a day (BID) | ORAL | Status: DC

## 2013-08-06 MED ORDER — POTASSIUM CHLORIDE CRYS ER 20 MEQ PO TBCR: 20.0000 meq | EXTENDED_RELEASE_TABLET | Freq: Two times a day (BID) | ORAL | Status: DC

## 2013-08-06 MED ORDER — NICOTINE 14 MG/24HR TD PT24: 1.0000 | MEDICATED_PATCH | Freq: Every day | TRANSDERMAL | Status: DC | PRN

## 2013-08-06 NOTE — Telephone Encounter (Signed)
New problem   Pt has 7 day TCM per [place] PA calling 08/13/13.

## 2013-08-06 NOTE — Progress Notes (Signed)
Subjective: Deneis CP  No SOB. Objective: Filed Vitals:   08/06/13 0500 08/06/13 0600 08/06/13 0700 08/06/13 0800  BP:    123/97  Pulse:  87 81   Temp:    97.8 F (36.6 C)  TempSrc:    Oral  Resp:  15 16 20   Height:      Weight: 315 lb 0.6 oz (142.9 kg)     SpO2:  96% 96% 98%   Weight change: -2 lb 3.3 oz (-1 kg)  Intake/Output Summary (Last 24 hours) at 08/06/13 0951 Last data filed at 08/06/13 0700  Gross per 24 hour  Intake    866 ml  Output   2200 ml  Net  -1334 ml    General: Alert, awake, oriented x3, in no acute distress Neck:  supple Heart: Regular rate and rhythm, +S3 Lungs: Clear to auscultation.   Abd: soft, NT, ND Exemities:  1+ edema.  Neuro: Grossly intact, nonfocal.  Tele:  SR Lab Results: Results for orders placed during the hospital encounter of 08/03/13 (from the past 24 hour(s))  PROTIME-INR     Status: None   Collection Time    08/05/13 12:55 PM      Result Value Range   Prothrombin Time 14.4  11.6 - 15.2 seconds   INR 1.14  0.00 - 1.49  GLUCOSE, CAPILLARY     Status: None   Collection Time    08/05/13  1:33 PM      Result Value Range   Glucose-Capillary 89  70 - 99 mg/dL  POCT I-STAT 3, BLOOD GAS (G3P V)     Status: Abnormal   Collection Time    08/05/13  6:25 PM      Result Value Range   pH, Ven 7.359 (*) 7.250 - 7.300   pCO2, Ven 46.0  45.0 - 50.0 mmHg   pO2, Ven 24.0 (*) 30.0 - 45.0 mmHg   Bicarbonate 25.9 (*) 20.0 - 24.0 mEq/L   TCO2 27  0 - 100 mmol/L   O2 Saturation 40.0     Sample type VENOUS     Comment VALUES EXPECTED, NO REPEAT    POCT I-STAT 3, BLOOD GAS (G3P V)     Status: Abnormal   Collection Time    08/05/13  6:28 PM      Result Value Range   pH, Ven 7.410 (*) 7.250 - 7.300   pCO2, Ven 42.5 (*) 45.0 - 50.0 mmHg   pO2, Ven 30.0  30.0 - 45.0 mmHg   Bicarbonate 27.0 (*) 20.0 - 24.0 mEq/L   TCO2 28  0 - 100 mmol/L   O2 Saturation 57.0     Acid-Base Excess 2.0  0.0 - 2.0 mmol/L   Sample type VENOUS     Comment  VALUES EXPECTED, NO REPEAT    POCT I-STAT 3, BLOOD GAS (G3+)     Status: Abnormal   Collection Time    08/05/13  6:34 PM      Result Value Range   pH, Arterial 7.476 (*) 7.350 - 7.450   pCO2 arterial 35.3  35.0 - 45.0 mmHg   pO2, Arterial 100.0  80.0 - 100.0 mmHg   Bicarbonate 26.0 (*) 20.0 - 24.0 mEq/L   TCO2 27  0 - 100 mmol/L   O2 Saturation 98.0     Acid-Base Excess 3.0 (*) 0.0 - 2.0 mmol/L   Sample type ARTERIAL    POCT I-STAT 3, BLOOD GAS (G3+)     Status: Abnormal  Collection Time    08/05/13  6:43 PM      Result Value Range   pH, Arterial 7.452 (*) 7.350 - 7.450   pCO2 arterial 37.3  35.0 - 45.0 mmHg   pO2, Arterial 101.0 (*) 80.0 - 100.0 mmHg   Bicarbonate 26.1 (*) 20.0 - 24.0 mEq/L   TCO2 27  0 - 100 mmol/L   O2 Saturation 98.0     Acid-Base Excess 2.0  0.0 - 2.0 mmol/L   Sample type ARTERIAL    POCT ACTIVATED CLOTTING TIME     Status: None   Collection Time    08/05/13  6:46 PM      Result Value Range   Activated Clotting Time 171    BASIC METABOLIC PANEL     Status: Abnormal   Collection Time    08/06/13  3:50 AM      Result Value Range   Sodium 136  135 - 145 mEq/L   Potassium 4.5  3.5 - 5.1 mEq/L   Chloride 100  96 - 112 mEq/L   CO2 23  19 - 32 mEq/L   Glucose, Bld 94  70 - 99 mg/dL   BUN 12  6 - 23 mg/dL   Creatinine, Ser 1.61  0.50 - 1.35 mg/dL   Calcium 9.1  8.4 - 09.6 mg/dL   GFR calc non Af Amer 74 (*) >90 mL/min   GFR calc Af Amer 86 (*) >90 mL/min    Studies/Results: Echo:  08/04/13  - Left ventricle: The cavity size was severely dilated. Wall thickness was normal. Systolic function was severely reduced. The estimated ejection fraction was in the range of 20% to 25%. Diffuse hypokinesis. - Left atrium: The atrium was moderately to severely dilated. - Right ventricle: The cavity size was moderately dilated. Systolic function was moderately reduced. - Right atrium: The atrium was moderately dilated. - Tricuspid valve: Moderate  regurgitation. - Pulmonary arteries: Systolic pressure was mildly to moderately increased. PA peak pressure: 48mm Hg (S). Echocardiography. M-mode, complete 2D, spectral Doppler, and color Doppler. Height: Height: 177.8cm. Height: 70in. Weight: Weight: 145.6kg. Weight: 320.3lb. Body mass index: BMI: 46.1kg/m^2. Body surface area: BSA: 2.3m^2. Blood pressure: 159/116. Patient status: Inpatient. Location: ICU/CCU  ------------------------------------------------------------  ------------------------------------------------------------ Left ventricle: The cavity size was severely dilated. Wall thickness was normal. Systolic function was severely reduced. The estimated ejection fraction was in the range of 20% to 25%. Diffuse hypokinesis. Early diastolic septal annular tissue Doppler velocities Ea were abnormal.  ------------------------------------------------------------ Aortic valve: Structurally normal valve. Cusp separation was normal. Doppler: Transvalvular velocity was within the normal range. There was no stenosis. No regurgitation.  ------------------------------------------------------------ Aorta: Aortic root: The aortic root was normal in size. Ascending aorta: The ascending aorta was mildly dilated.  ------------------------------------------------------------ Mitral valve: Structurally normal valve. Leaflet separation was normal. Doppler: Transvalvular velocity was within the normal range. There was no evidence for stenosis. No regurgitation. Peak gradient: 5mm Hg (D).  ------------------------------------------------------------ Left atrium: The atrium was moderately to severely dilated.  ------------------------------------------------------------ Right ventricle: The cavity size was moderately dilated. Systolic function was moderately reduced.  ------------------------------------------------------------ Tricuspid valve: Structurally normal valve.  Doppler: Moderate regurgitation.  ------------------------------------------------------------ Pulmonary artery: Systolic pressure was mildly to moderately increased.  ------------------------------------------------------------ Right atrium: The atrium was moderately dilated.  ------------------------------------------------------------ Pericardium: There was no pericardial effusion   Medications: Reviewed    1.  Acute systolic CHF  Patient imroving; states he must be DCed today; will DC on lisinopril, coreg, spironolactone, lasix 40 BID and KCL  20 BID; check bmet Monday with results to Dr Tenny Craw. FU Dr Tenny Craw one week. He lives in [place] and will ultimately need cardiologist there. Repeat echo in 3 months; if EF<35, ICD. Patient counseled on avoiding ETOH.  2.  HTN  Improved  3.  NSVT Continue beta blocker  4.  Microcytic aemia - fu with his primary care in [place].  > 30 min PA and physician time D2  LOS: 3 days   Olga Millers 08/06/2013, 9:51 AM

## 2013-08-06 NOTE — Discharge Summary (Signed)
See progress notes Brian Crenshaw  

## 2013-08-06 NOTE — Discharge Summary (Signed)
Patient ID: Jermaine Herring,  MRN: [id number], DOB/AGE: [birth year] 40 y.o.  Admit date: 08/03/2013 Discharge date: 08/06/2013  Primary Care Provider: None Primary Cardiologist: Lovina Reach, MD   Discharge Diagnoses Principal Problem:   Acute systolic CHF (congestive heart failure)  **Net negative diuresis of 15.5L, with reduction in weight from 343 lbs on admission to 315 lbs at discharge.  Active Problems:   Nonischemic cardiomyopathy  **EF 20-25% on echocardiogram this admission.  **Normal coronary arteries on catheterization this admission.   History of substance abuse   Tobacco abuse   Obesity   NSVT (nonsustained ventricular tachycardia)   Microcytic anemia   Crohn disease   Hypokalemia  Allergies Allergies  Allergen Reactions  . Mushroom Extract Complex Anaphylaxis   Procedures  2D Echocardiogram 9.3.2014  Study Conclusions  - Left ventricle: The cavity size was severely dilated. Wall   thickness was normal. Systolic function was severely   reduced. The estimated ejection fraction was in the range   of 20% to 25%. Diffuse hypokinesis. - Left atrium: The atrium was moderately to severely   dilated. - Right ventricle: The cavity size was moderately dilated.   Systolic function was moderately reduced. - Right atrium: The atrium was moderately dilated. - Tricuspid valve: Moderate regurgitation. - Pulmonary arteries: Systolic pressure was mildly to   moderately increased. PA peak pressure: 48mm Hg (S). _____________  Cardiac Catheterization 9.4.2014  Procedural Findings: Hemodynamics RA 21 RV 55/22 PA 52/30 mean 37 PCWP 29 LV 116/29 AO 115/89  Oxygen saturations: PA 57 AO 97  Cardiac Output (Fick) 5.2  Cardiac Index (Fick) 2.0            Coronary angiography: Coronary dominance: right  Left mainstem: Widely patent, normal vessel Left anterior descending (LAD): Patent to the LV apex, patent diagonal Left circumflex (LCx): supplies large OM without  significant disease Right coronary artery (RCA): dominant vessel, large in caliber. Minor irregularity in the mid-RCA but no significant stenosis Left ventriculography: deferred  Final Conclusions:   1. Patent coronaries with mild irregularities 2. CHF with elevated filling pressures _____________   History of Present Illness  42 y/o male without prior cardiac history.  He does have a h/o diverticulitis and crohn's disease, however has not been seen by a doctor in approximately 1.5 yrs.  August 4th this year marked the 1 yr anniversary of his mother's death and he decided that he was going to try and get himself in shape. He quit drinking and smoking pot and cut back his cigarette usage to 4 cigs/day. He switched to an organic diet and started going to the gym. He noted weight loss and feeling better. He says that 2 wks ago, he was down to 290 lbs.  He came to [place] about a week ago to clean out his mother's home. He began to note that he was having some swelling in his legs and he also noted loss of appetite, saying that he would eat a few bites of a piece of fruit and then feel full. On Saturday morning, 8/30, he awoke and noted dyspnea as soon as he sat up in bed. He also noted significant abdominal bloating. Since 8/30, early satiety, dyspnea, lower ext edema, and abdominal bloating have worsened. He's also had significant orthopnea. He has not had any chest pain. He has not had any recent viral infections. As a result of progressive Ss, he presented to the ED on 9/2.  There, he was tachycardic and CXR showed pulmonary edema.  He was noted to have significant volume overload on exam and was admitted for further evaluation.   Hospital Course  Patient ruled out for myocardial infarction.  He was aggressively diuresed and also initiated on beta blocker, ace inhibitor, and later spironolactone therapy.  2D echo on 9/3 showed significant LV dysfunction with an EF of 20-25% with diffuse hypokinesis.  As  a result, right and left heart cardiac catheterization was arranged and took place on 9/4, revealing normal coronary arteries and persistently elevated right and left sided filling pressures.  IV diuresis was continued with symptomatic improvement and a net negative diuresis of 15.5 L for this admission with resultant 28 lb weight loss over a span of 3 days.    With diuresis, he noted significant clinical improvement.  He also c/o leg cramping and toe pain, and was noted to be hypokalemic.  He has been placed on potassium supplementation along with colchicine for presumed gout flare.  He has also had intermittent, asymptomatic NSVT, for which beta blocker dosing was titrated.  He requesting discharge today and his medications have been consolidated.  He will be discharged home today in good condition with early follow-up arranged for 1 week from today.  We have also arranged for a f/u BMET in our office in 3 days.  He has been counseled on the importance of polysubstance cessation, medication and lifestyle compliance, daily weights, and symptom reporting.  Discharge Vitals Blood pressure 123/97, pulse 81, temperature 97.8 F (36.6 C), temperature source Oral, resp. rate 20, height 5\' 10"  (1.778 m), weight 315 lb 0.6 oz (142.9 kg), SpO2 98.00%.  Filed Weights   08/04/13 0500 08/05/13 0500 08/06/13 0500  Weight: 321 lb 10.4 oz (145.9 kg) 317 lb 3.9 oz (143.9 kg) 315 lb 0.6 oz (142.9 kg)   Labs  CBC Lab Results  Component Value Date   WBC 3.9* 08/03/2013   HGB 11.9* 08/03/2013   HCT 35.9* 08/03/2013   MCV 75.9* 08/03/2013   PLT 449* 08/03/2013   Basic Metabolic Panel  Recent Labs  08/03/13 1726  08/05/13 0425 08/06/13 0350  NA  --   < > 134* 136  K  --   < > 4.1 4.5  CL  --   < > 98 100  CO2  --   < > 24 23  GLUCOSE  --   < > 93 94  BUN  --   < > 10 12  CREATININE  --   < > 1.14 1.20  CALCIUM  --   < > 8.7 9.1  MG 1.7  --   --   --   < > = values in this interval not displayed. Liver  Function Tests Lab Results  Component Value Date   ALT 7 08/03/2013   AST 15 08/03/2013   ALKPHOS 74 08/03/2013   BILITOT 1.2 08/03/2013   Cardiac Enzymes  Recent Labs  08/03/13 1726 08/03/13 2240 08/04/13 0400  TROPONINI <0.30 <0.30 <0.30   Thyroid Function Tests  Recent Labs  08/03/13 1726  TSH 3.130   Disposition  Pt is being discharged home today in good condition.  Follow-up Plans & Appointments      Follow-up Information   Follow up with Norma Fredrickson, NP On 08/13/2013. (10:30 AM)    Specialty:  Nurse Practitioner   Contact information:   [address] [place] [address] [phone]       Follow up with [facility] On 08/09/2013. (blood chemistry -  anytime between 8:30 AM and 3:30 PM.)    Contact information:   [address] [place] [address] [phone]     Discharge Medications    Medication List         carvedilol 6.25 MG tablet  Commonly known as:  COREG  Take 1 tablet (6.25 mg total) by mouth 2 (two) times daily with a meal.     colchicine 0.6 MG tablet  Take 1 tablet (0.6 mg total) by mouth daily.     furosemide 40 MG tablet  Commonly known as:  LASIX  Take 1 tablet (40 mg total) by mouth 2 (two) times daily.     lisinopril 10 MG tablet  Commonly known as:  PRINIVIL,ZESTRIL  Take 1 tablet (10 mg total) by mouth daily.     nicotine 14 mg/24hr patch  Commonly known as:  NICODERM CQ - dosed in mg/24 hours  Place 1 patch onto the skin daily as needed (nicotine withdrawal).     potassium chloride SA 20 MEQ tablet  Commonly known as:  K-DUR,KLOR-CON  Take 1 tablet (20 mEq total) by mouth 2 (two) times daily.     spironolactone 25 MG tablet  Commonly known as:  ALDACTONE  Take 0.5 tablets (12.5 mg total) by mouth daily.      Outstanding Labs/Studies  F/U bmet on 08/09/2013.  Duration of Discharge Encounter   Greater than 30 minutes including physician time.  Signed, Nicolasa Ducking  NP 08/06/2013, 1:03 PM

## 2013-08-09 ENCOUNTER — Other Ambulatory Visit (INDEPENDENT_AMBULATORY_CARE_PROVIDER_SITE_OTHER): Payer: Medicaid Other

## 2013-08-09 DIAGNOSIS — E876 Hypokalemia: Secondary | ICD-10-CM

## 2013-08-09 LAB — BASIC METABOLIC PANEL
CO2: 27 mEq/L (ref 19–32)
Calcium: 9.5 mg/dL (ref 8.4–10.5)
Potassium: 4.3 mEq/L (ref 3.5–5.1)
Sodium: 134 mEq/L — ABNORMAL LOW (ref 135–145)

## 2013-08-09 NOTE — Telephone Encounter (Signed)
Patient contacted regarding discharge from [facility] on 08/03/13.  Patient understands to follow up with provider Norma Fredrickson NP on 08/13/13 at 10:30 AM at [facility] [address]  office. Patient understands discharge instructions? yes Patient understands medications and regiment? yes Patient understands to bring all medications to this visit? yes   pt is aware to come for labs on 08/09/13.

## 2013-08-12 ENCOUNTER — Encounter (HOSPITAL_COMMUNITY): Payer: Self-pay | Admitting: Emergency Medicine

## 2013-08-12 ENCOUNTER — Emergency Department (HOSPITAL_COMMUNITY)
Admission: EM | Admit: 2013-08-12 | Discharge: 2013-08-12 | Disposition: A | Discharge status: Home / Self Care | Payer: Medicaid Other | Attending: Emergency Medicine | Admitting: Emergency Medicine

## 2013-08-12 ENCOUNTER — Telehealth: Payer: Self-pay | Admitting: *Deleted

## 2013-08-12 DIAGNOSIS — K089 Disorder of teeth and supporting structures, unspecified: Secondary | ICD-10-CM | POA: Insufficient documentation

## 2013-08-12 DIAGNOSIS — I5022 Chronic systolic (congestive) heart failure: Secondary | ICD-10-CM | POA: Insufficient documentation

## 2013-08-12 DIAGNOSIS — K0889 Other specified disorders of teeth and supporting structures: Secondary | ICD-10-CM

## 2013-08-12 DIAGNOSIS — Z79899 Other long term (current) drug therapy: Secondary | ICD-10-CM | POA: Insufficient documentation

## 2013-08-12 DIAGNOSIS — E669 Obesity, unspecified: Secondary | ICD-10-CM | POA: Insufficient documentation

## 2013-08-12 DIAGNOSIS — Z862 Personal history of diseases of the blood and blood-forming organs and certain disorders involving the immune mechanism: Secondary | ICD-10-CM | POA: Insufficient documentation

## 2013-08-12 DIAGNOSIS — F17200 Nicotine dependence, unspecified, uncomplicated: Secondary | ICD-10-CM | POA: Insufficient documentation

## 2013-08-12 MED ORDER — CLINDAMYCIN HCL 150 MG PO CAPS: 150.0000 mg | ORAL_CAPSULE | Freq: Four times a day (QID) | ORAL | Status: DC

## 2013-08-12 MED ORDER — HYDROCODONE-ACETAMINOPHEN 5-325 MG PO TABS: 1.0000 | ORAL_TABLET | ORAL | Status: DC | PRN

## 2013-08-12 NOTE — Telephone Encounter (Signed)
Per nurse, she is there for assessment for CHF and she states he is doing well with that.  He has a large abscess on his jaw, she wanted him seen today. Informed he needs to see his pcp or dentist, pt has not established with anyone yet. Told her to go to Urgent care or ED, she verbalized understanding and will advise the pt.

## 2013-08-12 NOTE — ED Notes (Signed)
Left side of face swelling x 1 week  He does  Not think it is his tooth . States it started when he was in hospital before

## 2013-08-12 NOTE — ED Provider Notes (Signed)
CSN: [id number]     Arrival date & time 08/12/13  1112 History   First MD Initiated Contact with Patient 08/12/13 1243     Chief Complaint  Patient presents with  . Facial Pain   (Consider location/radiation/quality/duration/timing/severity/associated sxs/prior Treatment) HPI  Patient presents to the emergency department with a dental complaint. Symptoms began 1 week ago. The patient has tried to alleviate pain with OTC medications.  Pain rated at a 10/10, characterized as throbbing in nature and located left lower molar. Patient denies fever, night sweats, chills, difficulty swallowing or opening mouth, SOB, nuchal rigidity or decreased ROM of neck.  Patient does not have a dentist and requests a resource guide at discharge.   Past Medical History  Diagnosis Date  . Crohn disease     a. Followed by Dr. Greggory Brandy, [facility].  Marland Kitchen Obesity   . Diverticulitis     a. with h/o perforation and multiple abscesses req loop ileostomy, sigmoid colectomy, and SB resectionx 2 01/2010.  . Tobacco abuse   . History of substance abuse     a. previously drank heavily on weekends and used marijuana daily x 20+ yrs.  Quit both 07/05/2013.  Marland Kitchen Nonischemic cardiomyopathy     a. 08/2013 Cath: Nl cors.  . Chronic systolic CHF (congestive heart failure)     a. 08/2013 Echo: EF 20-25%, diff HK, mod-sev dil LA, mod reduced RV fxn, mod dil RV/RA, mod TR, PASP .  . Microcytic anemia   . NSVT (nonsustained ventricular tachycardia)     a. noted during hospitalization for CHF 08/2013-->bb therapy.   Past Surgical History  Procedure Laterality Date  . Colon surgery    . Ileostomy reversal     Family History  Problem Relation Age of Onset  . Cancer Mother    History  Substance Use Topics  . Smoking status: Current Every Day Smoker -- 0.25 packs/day    Types: Cigarettes  . Smokeless tobacco: Not on file     Comment: previously smoked 1ppd x 20+ yrs, now smoking 4 cigs/day (08/2013).  Marland Kitchen Alcohol Use: Yes   Comment: previously drank heavily on weekends.  Quit all ETOH on 07/05/2013.    Review of Systems ROS is negative unless otherwise stated in the HPI  Allergies  Mushroom extract complex  Home Medications   Current Outpatient Rx  Name  Route  Sig  Dispense  Refill  . carvedilol (COREG) 6.25 MG tablet   Oral   Take 1 tablet (6.25 mg total) by mouth 2 (two) times daily with a meal.   60 tablet   6   . colchicine 0.6 MG tablet   Oral   Take 0.6 mg by mouth daily as needed (gout).         . famotidine (PEPCID) 20 MG tablet   Oral   Take 20 mg by mouth once as needed for heartburn.         . furosemide (LASIX) 40 MG tablet   Oral   Take 1 tablet (40 mg total) by mouth 2 (two) times daily.   60 tablet   3   . lisinopril (PRINIVIL,ZESTRIL) 10 MG tablet   Oral   Take 1 tablet (10 mg total) by mouth daily.   30 tablet   3   . nicotine (NICODERM CQ - DOSED IN MG/24 HOURS) 21 mg/24hr patch   Transdermal   Place 1 patch onto the skin daily.         . potassium  chloride SA (K-DUR,KLOR-CON) 20 MEQ tablet   Oral   Take 1 tablet (20 mEq total) by mouth 2 (two) times daily.   60 tablet   3   . spironolactone (ALDACTONE) 25 MG tablet   Oral   Take 0.5 tablets (12.5 mg total) by mouth daily.   30 tablet   1   . clindamycin (CLEOCIN) 150 MG capsule   Oral   Take 1 capsule (150 mg total) by mouth every 6 (six) hours.   28 capsule   0   . HYDROcodone-acetaminophen (NORCO/VICODIN) 5-325 MG per tablet   Oral   Take 1-2 tablets by mouth every 4 (four) hours as needed for pain.   20 tablet   0    BP 117/88  Pulse 97  Temp(Src) 98.4 F (36.9 C)  Resp 16  SpO2 99% Physical Exam  Nursing note and vitals reviewed. Constitutional: He appears well-developed and well-nourished.  HENT:  Head: Normocephalic and atraumatic.  Mouth/Throat: Dental caries present.    Eyes: Conjunctivae and EOM are normal. Pupils are equal, round, and reactive to light.  Neck: Normal  range of motion. Neck supple.  Cardiovascular: Normal rate and regular rhythm.   Pulmonary/Chest: Effort normal and breath sounds normal.    ED Course  Procedures (including critical care time) Labs Review Labs Reviewed - No data to display Imaging Review No results found.  MDM   1. Toothache    Patient has dental pain. No emergent s/sx's present. Patent airway. No trismus.  Will be given pain medication and antibiotics. I discussed the need to call dentist within 24/48 hours for follow-up. Dental referral given. Return to ED precautions given.  Pt voiced understanding and has agreed to follow-up.    42 y.o.Jermaine Herring's evaluation in the Emergency Department is complete. It has been determined that no acute conditions requiring further emergency intervention are present at this time. The patient/guardian have been advised of the diagnosis and plan. We have discussed signs and symptoms that warrant return to the ED, such as changes or worsening in symptoms.  Vital signs are stable at discharge. Filed Vitals:   08/12/13 1120  BP: 117/88  Pulse: 97  Temp: 98.4 F (36.9 C)  Resp: 16    Patient/guardian has voiced understanding and agreed to follow-up with the PCP or specialist.     Dorthula Matas, PA-C 08/12/13 1331

## 2013-08-12 NOTE — ED Provider Notes (Signed)
Medical screening examination/treatment/procedure(s) were performed by non-physician practitioner and as supervising physician I was immediately available for consultation/collaboration.   Salomon Ganser, MD 08/12/13 1541 

## 2013-08-12 NOTE — ED Notes (Signed)
Pt c/o left lower dental pain and facial swelling x 8 days. States he has a "cracked tooth" there.

## 2013-08-13 ENCOUNTER — Ambulatory Visit (INDEPENDENT_AMBULATORY_CARE_PROVIDER_SITE_OTHER): Payer: Medicaid Other | Admitting: Nurse Practitioner

## 2013-08-13 ENCOUNTER — Encounter: Payer: Self-pay | Admitting: Nurse Practitioner

## 2013-08-13 VITALS — BP 100/70 | HR 84 | Ht 70.0 in | Wt 301.8 lb

## 2013-08-13 DIAGNOSIS — I428 Other cardiomyopathies: Secondary | ICD-10-CM

## 2013-08-13 MED ORDER — POTASSIUM CHLORIDE CRYS ER 20 MEQ PO TBCR: 20.0000 meq | EXTENDED_RELEASE_TABLET | Freq: Every day | ORAL | Status: DC

## 2013-08-13 MED ORDER — SPIRONOLACTONE 25 MG PO TABS: 25.0000 mg | ORAL_TABLET | Freq: Every day | ORAL | Status: DC

## 2013-08-13 MED ORDER — FUROSEMIDE 40 MG PO TABS: 40.0000 mg | ORAL_TABLET | Freq: Every day | ORAL | Status: DC

## 2013-08-13 NOTE — Progress Notes (Signed)
Jermaine Herring Date of Birth: [birth year] Medical Record #[id number]  History of Present Illness: Jermaine Herring is seen back today for a TOC visit. Seen for Jermaine Herring. Has a newly diagnosed nonischemic CM with EF 20 to 25%, substance abuse (alcohol and marijuana), tobacco abuse, obesity, NSVT, microcytic anemia, Crohn's disease and normal coronaries per past cath.   Most recently in the hospital with acute systolic HF - diuresed 15.5 L with weight from 343 to 315 pounds. Cath showed normal coronaries. He was actually here from [place] closing out his mom's estate. Not planning on staying in [place] permanently.   Comes back today. Here alone. Doing "fantastic". He is not short of breath. No swelling. Weight continues to drop. Not dizzy or lightheaded. No palpitations. Walking. Not smoking or drinking (apparently was a binge drinker of a significant amount prior). Tolerating his medicines. Has had an abscessed tooth - went to the ER yesterday and given antibiotics.   Current Outpatient Prescriptions  Medication Sig Dispense Refill  . carvedilol (COREG) 6.25 MG tablet Take 1 tablet (6.25 mg total) by mouth 2 (two) times daily with a meal.  60 tablet  6  . colchicine 0.6 MG tablet Take 0.6 mg by mouth daily as needed (gout).      . famotidine (PEPCID) 20 MG tablet Take 20 mg by mouth once as needed for heartburn.      . furosemide (LASIX) 40 MG tablet Take 1 tablet (40 mg total) by mouth 2 (two) times daily.  60 tablet  3  . HYDROcodone-acetaminophen (NORCO/VICODIN) 5-325 MG per tablet Take 1-2 tablets by mouth every 4 (four) hours as needed for pain.  20 tablet  0  . lisinopril (PRINIVIL,ZESTRIL) 10 MG tablet Take 1 tablet (10 mg total) by mouth daily.  30 tablet  3  . potassium chloride SA (K-DUR,KLOR-CON) 20 MEQ tablet Take 1 tablet (20 mEq total) by mouth 2 (two) times daily.  60 tablet  3  . spironolactone (ALDACTONE) 25 MG tablet Take 0.5 tablets (12.5 mg total) by mouth daily.  30 tablet  1  .  clindamycin (CLEOCIN) 150 MG capsule Take 1 capsule (150 mg total) by mouth every 6 (six) hours.  28 capsule  0   No current facility-administered medications for this visit.    Allergies  Allergen Reactions  . Mushroom Extract Complex Anaphylaxis    Past Medical History  Diagnosis Date  . Crohn disease     a. Followed by Jermaine Herring, [facility].  Marland Kitchen Obesity   . Diverticulitis     a. with h/o perforation and multiple abscesses req loop ileostomy, sigmoid colectomy, and SB resectionx 2 01/2010.  . Tobacco abuse   . History of substance abuse     a. previously drank heavily on weekends and used marijuana daily x 20+ yrs.  Quit both 07/05/2013.  Marland Kitchen Nonischemic cardiomyopathy     a. 08/2013 Cath: Nl cors.  . Chronic systolic CHF (congestive heart failure)     a. 08/2013 Echo: EF 20-25%, diff HK, mod-sev dil LA, mod reduced RV fxn, mod dil RV/RA, mod TR, PASP .  . Microcytic anemia   . NSVT (nonsustained ventricular tachycardia)     a. noted during hospitalization for CHF 08/2013-->bb therapy.    Past Surgical History  Procedure Laterality Date  . Colon surgery    . Ileostomy reversal      History  Smoking status  . Former Smoker -- 0.25 packs/day  . Types: Cigarettes  .  Quit date: 08/03/2013  Smokeless tobacco  . Not on file    Comment: previously smoked 1ppd x 20+ yrs, now smoking 4 cigs/day (08/2013).    History  Alcohol Use  . Yes    Comment: previously drank heavily on weekends.  Quit all ETOH on 07/05/2013.    Family History  Problem Relation Age of Onset  . Cancer Mother     Review of Systems: The review of systems is per the HPI.  All other systems were reviewed and are negative.  Physical Exam: BP 100/70  Pulse 84  Ht 5\' 10"  (1.778 m)  Wt 301 lb 12.8 oz (136.896 kg)  BMI 43.3 kg/m2 Patient is very pleasant and in no acute distress. He remains obese but weight continues to drop. Skin is warm and dry. Color is normal.  HEENT is unremarkable but does have  some left sided facial swelling. Normocephalic/atraumatic. PERRL. Sclera are nonicteric. Neck is supple. No masses. No JVD. Lungs are clear. Cardiac exam shows a regular rate and rhythm. + S3. Abdomen is soft. Extremities are without edema. Gait and ROM are intact. No gross neurologic deficits noted.  LABORATORY DATA: Lab Results  Component Value Date   WBC 3.9* 08/03/2013   HGB 11.9* 08/03/2013   HCT 35.9* 08/03/2013   PLT 449* 08/03/2013   GLUCOSE 89 08/09/2013   CHOL  Value: 51        ATP III CLASSIFICATION:  <200     mg/dL   Desirable  409-811  mg/dL   Borderline High  >=914    mg/dL   High        7/82/9562   TRIG 70 01/29/2010   ALT 7 08/03/2013   AST 15 08/03/2013   NA 134* 08/09/2013   K 4.3 08/09/2013   CL 100 08/09/2013   CREATININE 1.2 08/09/2013   BUN 13 08/09/2013   CO2 27 08/09/2013   TSH 3.130 08/03/2013   INR 1.14 08/05/2013   Echo Study Conclusions  - Left ventricle: The cavity size was severely dilated. Wall thickness was normal. Systolic function was severely reduced. The estimated ejection fraction was in the range of 20% to 25%. Diffuse hypokinesis. - Left atrium: The atrium was moderately to severely dilated. - Right ventricle: The cavity size was moderately dilated. Systolic function was moderately reduced. - Right atrium: The atrium was moderately dilated. - Tricuspid valve: Moderate regurgitation. - Pulmonary arteries: Systolic pressure was mildly to moderately increased. PA peak pressure: 48mm Hg (S).  Coronary angiography:  Coronary dominance: right  Left mainstem: Widely patent, normal vessel  Left anterior descending (LAD): Patent to the LV apex, patent diagonal  Left circumflex (LCx): supplies large OM without significant disease  Right coronary artery (RCA): dominant vessel, large in caliber. Minor irregularity in the mid-RCA but no significant stenosis  Left ventriculography: deferred   Final Conclusions:  1. Patent coronaries with mild irregularities  2. CHF with  elevated filling pressures  Recommendations: continue med Rx for CHF and IV diuresis with furosemide.  Jermaine Herring  08/05/2013, 6:59 PM    Assessment / Plan: 1. Systolic HF - EF of 20% - s/p exacerbation - now with NYHA I - and doing well. Tolerating his medicines. I have cut his Lasix and potassium back, increased the aldactone to 25mg . Check lab in a week. See him back in 2 weeks. He has a good understanding about the need for salt restriction, weights, etc. Will plan on repeating the echo 3 months after titration of  medicines.   2. S/P cardiac cath - no CAD  3. Obesity   4. HTN - BP is ok - totally asymptomatic. May limit how much we can titrate his medicines.   5. Abscessed tooth - on treatment.   I will see him in 2 weeks.   Patient is agreeable to this plan and will call if any problems develop in the interim.   Rosalio Macadamia, RN, ANP-C [facility] [address] [place], [place]  [address]

## 2013-08-13 NOTE — Patient Instructions (Addendum)
We need to check labs on your return visit  Continue with your current medicines except - I am cutting the Lasix back to just one a day, cutting the potassium back to just one a day, and increasing your Aldactone to a full tablet (25 mg) per day  Check lab in one week  Avoid salt  Weigh each morning - take an extra dose of your Lasix if weight goes up 2 to 3 pounds overnight  I will see you in 2 weeks  Call the [facility] office at [phone] if you have any questions, problems or concerns.    Cardiomyopathy Cardiomyopathy means a disease of the heart muscle. The heart muscle becomes enlarged or stiff. The heart is not able to pump enough blood or deliver enough oxygen to the body. This leads to heart failure and is the number one reason for heart transplants.  TYPES OF CARDIOMYOPATHY INCLUDE: DILATED  The most common type. The heart muscle is stretched out and weak so there is less blood pumped out.   Some causes:  Disease of the arteries of the heart (ischemia).  Heart attack with muscle scar.  Leaky or damaged valves.  After a viral illness.  Smoking.  High cholesterol.  Diabetes or overactive thyroid.  Alcohol or drug abuse.  High blood pressure.  May be reversible. HYPERTROPHIC The heart muscle grows bigger so there is less room for blood in the ventricle, and not enough blood is pumped out.   Causes include:  Mitral valve leaks.  Inherited tendency (from your family).  No explanation (idiopathic).  May be a cause of sudden death in young athletes with no symptoms. RESTRICTIVE The heart muscle becomes stiff, but not always larger. The heart has to work harder and will get weaker. Abnormal heart beats or rhythm (arrhythmia) are common.  Some causes:  Diseases in other parts of the body which may produce abnormal deposits in the heart muscle.  Probably not inherited.  A result of radiation treatment for cancer. SYMPTOMS  OF ALL TYPES:  Less able to exercise or tolerate physical activity.  Palpitations.  Irregular heart beat, heart arrhythmias.  Shortness of breath, even at rest.  Chest pain.  Lightheadedness or fainting. TREATMENT  Life-style changes including reducing salt, lowering cholesterol, stop smoking.  Manage contributing causes with medications.  Medicines to help reduce the fluids in the body.  An implanted cardioverter defibrillator (ICD) to improve heart function and correct arrhythmias.  Medications to relax the blood vessels and make it easier for the heart to pump.  Drugs that help regulate heart beat and improve heart relaxation, reducing the work of the heart.  Myomectomy for patients with hypertrophic cardiomyopathy and severe problems. This is a surgical procedure that removes a portion of the thickened muscle wall in order to improve heart output and provide symptom relief.  A heart transplant is an option in carefully applied circumstances. SEEK IMMEDIATE MEDICAL CARE IF:   You have severe chest pain, especially if the pain is crushing or pressure-like and spreads to the arms, back, neck, or jaw, or if you have sweating, feeling sick to your stomach (nausea), or shortness of breath. THIS IS AN EMERGENCY. Do not wait to see if the pain will go away. Get medical help at once. Call your local emergency services (911 in [place] DO NOT drive yourself to the hospital.  You develop severe shortness of breath.  You begin to cough up bloody sputum.  You are  unable to sleep because you cannot breathe.  You gain weight due to fluid retention.  You develop painful swelling in your calf or leg.  You feel your heart racing and it does not go away or happens when you are resting. Document Released: 01/31/2005 Document Revised: 02/10/2012 Document Reviewed: 07/06/2008 [facility] Patient Information 2014 [place], [place].

## 2013-08-20 ENCOUNTER — Other Ambulatory Visit (INDEPENDENT_AMBULATORY_CARE_PROVIDER_SITE_OTHER): Payer: Medicaid Other

## 2013-08-20 DIAGNOSIS — I428 Other cardiomyopathies: Secondary | ICD-10-CM

## 2013-08-20 LAB — BASIC METABOLIC PANEL
BUN: 12 mg/dL (ref 6–23)
CO2: 27 mEq/L (ref 19–32)
Calcium: 9 mg/dL (ref 8.4–10.5)
Chloride: 105 mEq/L (ref 96–112)
Creatinine, Ser: 1.1 mg/dL (ref 0.4–1.5)
GFR: 91.4 mL/min (ref >60.00)
Glucose, Bld: 99 mg/dL (ref 70–99)
Potassium: 4.1 mEq/L (ref 3.5–5.1)
Sodium: 137 mEq/L (ref 135–145)

## 2013-08-24 ENCOUNTER — Other Ambulatory Visit: Payer: Self-pay

## 2013-08-24 MED ORDER — SPIRONOLACTONE 25 MG PO TABS: 25.0000 mg | ORAL_TABLET | Freq: Every day | ORAL | Status: DC

## 2013-08-31 ENCOUNTER — Ambulatory Visit: Payer: Medicaid Other | Admitting: Nurse Practitioner

## 2013-09-14 ENCOUNTER — Encounter: Payer: Self-pay | Admitting: Nurse Practitioner

## 2013-09-14 ENCOUNTER — Ambulatory Visit (INDEPENDENT_AMBULATORY_CARE_PROVIDER_SITE_OTHER): Payer: Medicaid Other | Admitting: Nurse Practitioner

## 2013-09-14 VITALS — BP 170/68 | HR 68 | Ht 70.0 in | Wt 315.0 lb

## 2013-09-14 DIAGNOSIS — I428 Other cardiomyopathies: Secondary | ICD-10-CM

## 2013-09-14 DIAGNOSIS — R0609 Other forms of dyspnea: Secondary | ICD-10-CM

## 2013-09-14 DIAGNOSIS — R0600 Dyspnea, unspecified: Secondary | ICD-10-CM

## 2013-09-14 LAB — BASIC METABOLIC PANEL
BUN: 16 mg/dL (ref 6–23)
CO2: 27 mEq/L (ref 19–32)
Calcium: 9.3 mg/dL (ref 8.4–10.5)
Chloride: 101 mEq/L (ref 96–112)
Creatinine, Ser: 1.2 mg/dL (ref 0.4–1.5)
GFR: 86.95 mL/min (ref >60.00)
Glucose, Bld: 90 mg/dL (ref 70–99)
Potassium: 4.9 mEq/L (ref 3.5–5.1)
Sodium: 136 mEq/L (ref 135–145)

## 2013-09-14 LAB — BRAIN NATRIURETIC PEPTIDE: Pro B Natriuretic peptide (BNP): 114 pg/mL — ABNORMAL HIGH (ref 0.0–100.0)

## 2013-09-14 MED ORDER — CARVEDILOL 25 MG PO TABS: 25.0000 mg | ORAL_TABLET | Freq: Two times a day (BID) | ORAL | Status: DC

## 2013-09-14 NOTE — Patient Instructions (Addendum)
Stay on your current medicines but I am increasing the Coreg to 12.5 mg two times a day - you can take 2 of your 6.25 mg tablets twice a day to use those up and I have sent in a new prescription for the 25 mg tablet - to take only 1/2 of it two times a day  We need to check labs today  Limit your salt use  Continue to weigh daily  See me back in a month - we will plan on ordering your echo at that visit  See Dr. Tenny Craw in 2 months - mid to late December for discussion after the echo  Call the [facility] office at [phone] if you have any questions, problems or concerns.

## 2013-09-14 NOTE — Progress Notes (Signed)
Jermaine Herring Date of Birth: 1971-04-15 Medical Record #[id number]  History of Present Illness: Jermaine Herring is seen back today for a 2 week check. Seen for DR. Ross. Has a new diagnosis of nonischemic CM with an EF of 20 to 25%, substance abuse (alcohol and marijuana), tobacco abuse, obesity, NSVT, microcytic anemia, Crohn's disease and normal coronaries per past cath.   Was in the hospital last month with acute systolic HF - normal coronaries on cath - actually from [place] and here to just close out his mom's estate - was not planning on staying in [place] permanently. I cut his Lasix back and increased his Aldactone at our visit.   Comes back today. Here alone. Says he is doing "fantastic". Weight is up but not swelling. Not short of breath. No PND/orthopnea. Admits that he likes sweets. He is happy with how he feels. Tolerating his medicines. Planning on staying here until the end of the year. Will need repeat echo after December 3rd.    Current Outpatient Prescriptions  Medication Sig Dispense Refill  . carvedilol (COREG) 6.25 MG tablet Take 1 tablet (6.25 mg total) by mouth 2 (two) times daily with a meal.  60 tablet  6  . furosemide (LASIX) 40 MG tablet Take 1 tablet (40 mg total) by mouth daily.  60 tablet  3  . lisinopril (PRINIVIL,ZESTRIL) 10 MG tablet Take 1 tablet (10 mg total) by mouth daily.  30 tablet  3  . potassium chloride SA (K-DUR,KLOR-CON) 20 MEQ tablet Take 1 tablet (20 mEq total) by mouth daily.  60 tablet  3  . spironolactone (ALDACTONE) 25 MG tablet Take 1 tablet (25 mg total) by mouth daily.  30 tablet  1   No current facility-administered medications for this visit.    Allergies  Allergen Reactions  . Mushroom Extract Complex Anaphylaxis    Past Medical History  Diagnosis Date  . Crohn disease     a. Followed by Dr. Greggory Brandy, [facility].  Marland Kitchen Obesity   . Diverticulitis     a. with h/o perforation and multiple abscesses req loop ileostomy, sigmoid colectomy,  and SB resectionx 2 01/2010.  . Tobacco abuse   . History of substance abuse     a. previously drank heavily on weekends and used marijuana daily x 20+ yrs.  Quit both 07/05/2013.  Marland Kitchen Nonischemic cardiomyopathy     a. 08/2013 Cath: Nl cors.  . Chronic systolic CHF (congestive heart failure)     a. 08/2013 Echo: EF 20-25%, diff HK, mod-sev dil LA, mod reduced RV fxn, mod dil RV/RA, mod TR, PASP .  . Microcytic anemia   . NSVT (nonsustained ventricular tachycardia)     a. noted during hospitalization for CHF 08/2013-->bb therapy.    Past Surgical History  Procedure Laterality Date  . Colon surgery    . Ileostomy reversal      History  Smoking status  . Former Smoker -- 0.25 packs/day  . Types: Cigarettes  . Quit date: 08/03/2013  Smokeless tobacco  . Not on file    Comment: previously smoked 1ppd x 20+ yrs, now smoking 4 cigs/day (08/2013).    History  Alcohol Use  . Yes    Comment: previously drank heavily on weekends.  Quit all ETOH on 07/05/2013.    Family History  Problem Relation Age of Onset  . Cancer Mother   . Heart disease Father   . Hypertension Father   . Diabetes Father  Review of Systems: The review of systems is per the HPI.  All other systems were reviewed and are negative.  Physical Exam: BP 170/68  Pulse 68  Ht 5\' 10"  (1.778 m)  Wt 315 lb (142.883 kg)  BMI 45.2 kg/m2 Patient is very pleasant and in no acute distress. He is morbidly obese. Skin is warm and dry. Color is normal.  HEENT is unremarkable. Normocephalic/atraumatic. PERRL. Sclera are nonicteric. Neck is supple. No masses. No JVD. Lungs are clear. Cardiac exam shows a regular rate and rhythm. Abdomen is soft. Extremities are without edema. Gait and ROM are intact. No gross neurologic deficits noted.  LABORATORY DATA: BMET and BNP pending  Lab Results  Component Value Date   WBC 3.9* 08/03/2013   HGB 11.9* 08/03/2013   HCT 35.9* 08/03/2013   PLT 449* 08/03/2013   GLUCOSE 99 08/20/2013   CHOL   Value: 51        ATP III CLASSIFICATION:  <200     mg/dL   Desirable  191-478  mg/dL   Borderline High  >=295    mg/dL   High        05/22/3085   TRIG 70 01/29/2010   ALT 7 08/03/2013   AST 15 08/03/2013   NA 137 08/20/2013   K 4.1 08/20/2013   CL 105 08/20/2013   CREATININE 1.1 08/20/2013   BUN 12 08/20/2013   CO2 27 08/20/2013   TSH 3.130 08/03/2013   INR 1.14 08/05/2013   Echo Study Conclusions  - Left ventricle: The cavity size was severely dilated. Wall thickness was normal. Systolic function was severely reduced. The estimated ejection fraction was in the range of 20% to 25%. Diffuse hypokinesis. - Left atrium: The atrium was moderately to severely dilated. - Right ventricle: The cavity size was moderately dilated. Systolic function was moderately reduced. - Right atrium: The atrium was moderately dilated. - Tricuspid valve: Moderate regurgitation. - Pulmonary arteries: Systolic pressure was mildly to moderately increased. PA peak pressure: 48mm Hg (S).  Coronary angiography:  Coronary dominance: right  Left mainstem: Widely patent, normal vessel  Left anterior descending (LAD): Patent to the LV apex, patent diagonal  Left circumflex (LCx): supplies large OM without significant disease  Right coronary artery (RCA): dominant vessel, large in caliber. Minor irregularity in the mid-RCA but no significant stenosis  Left ventriculography: deferred  Final Conclusions:  1. Patent coronaries with mild irregularities  2. CHF with elevated filling pressures  Recommendations: continue med Rx for CHF and IV diuresis with furosemide.  Tonny Bollman  08/05/2013, 6:59 PM    Assessment / Plan: 1. Nonischemic CM - EF of 20 to 25% - trying to titrate medicines with plans to repeat echo after 3 months of medical therapy - Has not had his medicines yet today - I have increased his Coreg to 12.5 mg BID. See him back in a month. Echo after December 3rd with follow up with Dr. Tenny Craw afterwards. Recheck  labs today.   2. HTN - Coreg is increased today.   3. Substance abuse - says he is not smoking/drinking.   4. Morbid obesity  Patient is agreeable to this plan and will call if any problems develop in the interim.   Rosalio Macadamia, RN, ANP-C [facility] [address] [place], [place]  [address]

## 2013-09-27 ENCOUNTER — Telehealth: Payer: Self-pay | Admitting: Nurse Practitioner

## 2013-09-27 DIAGNOSIS — R0600 Dyspnea, unspecified: Secondary | ICD-10-CM

## 2013-09-27 DIAGNOSIS — I428 Other cardiomyopathies: Secondary | ICD-10-CM

## 2013-09-27 NOTE — Telephone Encounter (Signed)
New Problem  Pt needs clarification on Carvedilol medication that was prescribed// Please call

## 2013-09-27 NOTE — Telephone Encounter (Signed)
Confirmed with patient carvedilol dose is 12.5mg  two times a day (1/2 of a 25mg  tablet two times a day).

## 2013-10-15 ENCOUNTER — Ambulatory Visit: Payer: Medicaid Other | Admitting: Nurse Practitioner

## 2013-10-20 ENCOUNTER — Encounter: Payer: Self-pay | Admitting: Nurse Practitioner

## 2013-10-20 ENCOUNTER — Ambulatory Visit (INDEPENDENT_AMBULATORY_CARE_PROVIDER_SITE_OTHER): Payer: Medicaid Other | Admitting: Nurse Practitioner

## 2013-10-20 VITALS — BP 110/80 | HR 74 | Ht 70.5 in | Wt 329.8 lb

## 2013-10-20 DIAGNOSIS — I5022 Chronic systolic (congestive) heart failure: Secondary | ICD-10-CM

## 2013-10-20 NOTE — Patient Instructions (Addendum)
We will get an echo after December 3rd  See Dr. Tenny Craw as planned in December - she will go over your results of the echo at that time  Stay on your current medicines  Call the [facility] office at [phone] if you have any questions, problems or concerns.

## 2013-10-20 NOTE — Progress Notes (Signed)
Jermaine Herring Date of Birth: 09/26/1971 Medical Record #[id number]  History of Present Illness: Mr. Lahaie is seen back today for a 4 week check. Seen for DR. Ross. Has a new diagnosis of nonischemic CM with an EF of 20 to 25%, substance abuse (alcohol and marijuana), tobacco abuse, obesity, NSVT, microcytic anemia, Crohn's disease and normal coronaries per past cath.   Was in the hospital in September with acute systolic HF - normal coronaries on cath - actually from [place] and here to just close out his mom's estate - was not planning on staying in [place] permanently. I cut his Lasix back and increased his Aldactone at past visit.   Seen a month ago and he was doing ok. Weight was up but not swelling. Not short of breath. No PND/orthopnea. Admits that he likes sweets. He was happy with how he felt.  Will need repeat echo after December 3rd.   Comes back today. Here alone. Says he is not "behaving".  Eating too much. Not active. Limited by back pain. He says however he "feels great". He is not short of breath. Not swelling. Not bloated. No cough. No PND, orthopnea, etc. Planning on going to [place] at the end of the year - still trying to close his mother's estate to no avail. Now living in his mother's house with no heat. Weight is now back up 30 pounds in 2 months. He admits that he loves to eat. He is not drinking alcohol or smoking.   Current Outpatient Prescriptions  Medication Sig Dispense Refill  . carvedilol (COREG) 25 MG tablet 1/2 tablet (total 12.5mg ) two times a day      . furosemide (LASIX) 40 MG tablet Take 1 tablet (40 mg total) by mouth daily.  60 tablet  3  . lisinopril (PRINIVIL,ZESTRIL) 10 MG tablet Take 1 tablet (10 mg total) by mouth daily.  30 tablet  3  . potassium chloride SA (K-DUR,KLOR-CON) 20 MEQ tablet Take 1 tablet (20 mEq total) by mouth daily.  60 tablet  3  . spironolactone (ALDACTONE) 25 MG tablet Take 1 tablet (25 mg total) by mouth daily.  30 tablet  1    No current facility-administered medications for this visit.    Allergies  Allergen Reactions  . Mushroom Extract Complex Anaphylaxis    Past Medical History  Diagnosis Date  . Crohn disease     a. Followed by Dr. Greggory Brandy, [facility].  Marland Kitchen Obesity   . Diverticulitis     a. with h/o perforation and multiple abscesses req loop ileostomy, sigmoid colectomy, and SB resectionx 2 01/2010.  . Tobacco abuse   . History of substance abuse     a. previously drank heavily on weekends and used marijuana daily x 20+ yrs.  Quit both 07/05/2013.  Marland Kitchen Nonischemic cardiomyopathy     a. 08/2013 Cath: Nl cors.  . Chronic systolic CHF (congestive heart failure)     a. 08/2013 Echo: EF 20-25%, diff HK, mod-sev dil LA, mod reduced RV fxn, mod dil RV/RA, mod TR, PASP .  . Microcytic anemia   . NSVT (nonsustained ventricular tachycardia)     a. noted during hospitalization for CHF 08/2013-->bb therapy.    Past Surgical History  Procedure Laterality Date  . Colon surgery    . Ileostomy reversal      History  Smoking status  . Former Smoker -- 0.25 packs/day  . Types: Cigarettes  . Quit date: 08/03/2013  Smokeless tobacco  . Not  on file    Comment: previously smoked 1ppd x 20+ yrs, now smoking 4 cigs/day (08/2013).    History  Alcohol Use  . Yes    Comment: previously drank heavily on weekends.  Quit all ETOH on 07/05/2013.    Family History  Problem Relation Age of Onset  . Cancer Mother   . Heart disease Father   . Hypertension Father   . Diabetes Father     Review of Systems: The review of systems is per the HPI.  All other systems were reviewed and are negative.  Physical Exam: BP 110/80  Pulse 74  Ht 5' 10.5" (1.791 m)  Wt 329 lb 12.8 oz (149.596 kg)  BMI 46.64 kg/m2  SpO2 95% Patient is very pleasant and in no acute distress. He is morbidly obese. His weight is up 30 pounds over the past 2 months. Skin is warm and dry. Color is normal.  HEENT is unremarkable.  Normocephalic/atraumatic. PERRL. Sclera are nonicteric. Neck is supple. No masses. No JVD. Lungs are clear. Cardiac exam shows a regular rate and rhythm. His heart tones are distant.  Abdomen is obese but soft. Extremities are without edema. Gait and ROM are intact. No gross neurologic deficits noted.   Wt Readings from Last 3 Encounters:  10/20/13 329 lb 12.8 oz (149.596 kg)  09/14/13 315 lb (142.883 kg)  08/13/13 301 lb 12.8 oz (136.896 kg)    LABORATORY DATA:  Lab Results  Component Value Date   WBC 3.9* 08/03/2013   HGB 11.9* 08/03/2013   HCT 35.9* 08/03/2013   PLT 449* 08/03/2013   GLUCOSE 90 09/14/2013   CHOL  Value: 51        ATP III CLASSIFICATION:  <200     mg/dL   Desirable  161-096  mg/dL   Borderline High  >=045    mg/dL   High        03/10/8118   TRIG 70 01/29/2010   ALT 7 08/03/2013   AST 15 08/03/2013   NA 136 09/14/2013   K 4.9 09/14/2013   CL 101 09/14/2013   CREATININE 1.2 09/14/2013   BUN 16 09/14/2013   CO2 27 09/14/2013   TSH 3.130 08/03/2013   INR 1.14 08/05/2013     Assessment / Plan: 1. Systolic HF - with an EF of 20 to 25% - despite his significant weight gain - he feels "great". No real evidence of volume overload other than his weight - he admits that he is eating way too much. I have left him on his current regimen. BNP last month was basically normal.  Check echo after December 3rd. He has follow up with Dr. Tenny Craw in mid December.   2. Morbid obesity - this will be the crux of his issues  3. Polysubstance abuse - says he is abstaining.   Patient is agreeable to this plan and will call if any problems develop in the interim.   Rosalio Macadamia, RN, ANP-C [facility] [address] [place], [place]  [address]

## 2013-10-29 ENCOUNTER — Other Ambulatory Visit: Payer: Self-pay | Admitting: *Deleted

## 2013-10-29 ENCOUNTER — Other Ambulatory Visit: Payer: Self-pay | Admitting: Nurse Practitioner

## 2013-10-29 MED ORDER — SPIRONOLACTONE 25 MG PO TABS: 25.0000 mg | ORAL_TABLET | Freq: Once | ORAL | Status: DC

## 2013-10-29 NOTE — Telephone Encounter (Signed)
Follow up    [facility] [address]   ASAP  Please.   Pt took last pill 11/27.

## 2013-10-29 NOTE — Telephone Encounter (Signed)
Following up     Pt needs me today please.   Called in to [facility] [address]   Spironalactone  .

## 2013-11-04 ENCOUNTER — Encounter: Payer: Self-pay | Admitting: Cardiology

## 2013-11-04 ENCOUNTER — Ambulatory Visit (HOSPITAL_COMMUNITY): Payer: Medicaid Other | Attending: Cardiology | Admitting: Radiology

## 2013-11-04 DIAGNOSIS — I5022 Chronic systolic (congestive) heart failure: Secondary | ICD-10-CM

## 2013-11-04 DIAGNOSIS — F17200 Nicotine dependence, unspecified, uncomplicated: Secondary | ICD-10-CM | POA: Insufficient documentation

## 2013-11-04 DIAGNOSIS — I509 Heart failure, unspecified: Secondary | ICD-10-CM | POA: Insufficient documentation

## 2013-11-04 DIAGNOSIS — I359 Nonrheumatic aortic valve disorder, unspecified: Secondary | ICD-10-CM | POA: Insufficient documentation

## 2013-11-04 DIAGNOSIS — E669 Obesity, unspecified: Secondary | ICD-10-CM | POA: Insufficient documentation

## 2013-11-04 NOTE — Progress Notes (Signed)
Echocardiogram performed.  

## 2013-11-15 ENCOUNTER — Encounter: Payer: Self-pay | Admitting: Internal Medicine

## 2013-11-15 ENCOUNTER — Ambulatory Visit (INDEPENDENT_AMBULATORY_CARE_PROVIDER_SITE_OTHER): Payer: Medicaid Other | Admitting: Internal Medicine

## 2013-11-15 VITALS — BP 137/83 | HR 76 | Ht 69.5 in | Wt 330.1 lb

## 2013-11-15 DIAGNOSIS — R0602 Shortness of breath: Secondary | ICD-10-CM

## 2013-11-15 DIAGNOSIS — I509 Heart failure, unspecified: Secondary | ICD-10-CM

## 2013-11-15 LAB — BASIC METABOLIC PANEL WITH GFR
BUN: 15 mg/dL (ref 6–23)
CO2: 27 meq/L (ref 19–32)
Calcium: 9.1 mg/dL (ref 8.4–10.5)
Chloride: 103 meq/L (ref 96–112)
Creatinine, Ser: 1.2 mg/dL (ref 0.4–1.5)
GFR: 85.22 mL/min (ref >60.00)
Glucose, Bld: 91 mg/dL (ref 70–99)
Potassium: 4 meq/L (ref 3.5–5.1)
Sodium: 138 meq/L (ref 135–145)

## 2013-11-15 NOTE — Patient Instructions (Signed)
Your physician wants you to follow-up in: 4 MONTHS/ EARLY April 2015 You will receive a reminder letter in the mail two months in advance. If you don't receive a letter, please call our office to schedule the follow-up appointment.  Your physician recommends that you return for lab work in: TODAY BMET BNP  Your physician recommends that you continue on your current medications as directed. Please refer to the Current Medication list given to you today.

## 2013-11-15 NOTE — Progress Notes (Signed)
Jermaine Herring Date of Birth: 09/10/1971 Medical Record #[id number]  History of Present Illness: Jermaine Herring is seen back today f He is a 42 yo with a history of nonischemic CM with an EF of 20 to 25%, substance abuse (alcohol and marijuana), tobacco abuse, obesity, NSVT, microcytic anemia, Crohn's disease and normal coronaries per past cath.  He was last in clinic this fall  Seen by Jermaine Herring He is feeling great  Denies CP  Breathing is good  No edemia  No syncope  Current Outpatient Prescriptions  Medication Sig Dispense Refill  . carvedilol (COREG) 25 MG tablet 1/2 tablet (total 12.5mg ) two times a day      . furosemide (LASIX) 40 MG tablet Take 1 tablet (40 mg total) by mouth daily.  60 tablet  3  . lisinopril (PRINIVIL,ZESTRIL) 10 MG tablet Take 1 tablet (10 mg total) by mouth daily.  30 tablet  3  . potassium chloride SA (K-DUR,KLOR-CON) 20 MEQ tablet Take 1 tablet (20 mEq total) by mouth daily.  60 tablet  3  . spironolactone (ALDACTONE) 25 MG tablet Take 1 tablet (25 mg total) by mouth once.  30 tablet  11   No current facility-administered medications for this visit.    Allergies  Allergen Reactions  . Mushroom Extract Complex Anaphylaxis    Past Medical History  Diagnosis Date  . Crohn disease     a. Followed by Dr. Greggory Herring, [facility].  Marland Kitchen Obesity   . Diverticulitis     a. with h/o perforation and multiple abscesses req loop ileostomy, sigmoid colectomy, and SB resectionx 2 01/2010.  . Tobacco abuse   . History of substance abuse     a. previously drank heavily on weekends and used marijuana daily x 20+ yrs.  Quit both 07/05/2013.  Marland Kitchen Nonischemic cardiomyopathy     a. 08/2013 Cath: Nl cors.  . Chronic systolic CHF (congestive heart failure)     a. 08/2013 Echo: EF 20-25%, diff HK, mod-sev dil LA, mod reduced RV fxn, mod dil RV/RA, mod TR, PASP .  . Microcytic anemia   . NSVT (nonsustained ventricular tachycardia)     a. noted during hospitalization for CHF  08/2013-->bb therapy.    Past Surgical History  Procedure Laterality Date  . Colon surgery    . Ileostomy reversal      History  Smoking status  . Former Smoker -- 0.25 packs/day  . Types: Cigarettes  . Quit date: 08/03/2013  Smokeless tobacco  . Not on file    Comment: previously smoked 1ppd x 20+ yrs, now smoking 4 cigs/day (08/2013).    History  Alcohol Use  . Yes    Comment: previously drank heavily on weekends.  Quit all ETOH on 07/05/2013.    Family History  Problem Relation Age of Onset  . Cancer Mother   . Heart disease Father   . Hypertension Father   . Diabetes Father     Review of Systems: The review of systems is per the HPI.  All other systems were reviewed and are negative. Vital Signs: BP 137/83  Pulse 76  Ht 5' 9.5" (1.765 m)  Wt 330 lb 1.9 oz (149.741 kg)  BMI 48.07 kg/m2  Physical Exam Patient is a morbidly obese 42 yo in NAD HEENT:  Normocephalic, atraumatic. EOMI, PERRLA.  Neck: JVP is normal.  No bruits.  Lungs: clear to auscultation. No rales no wheezes.  Heart: Regular rate and rhythm. Normal S1, S2. No S3.  No significant murmurs. PMI not displaced.  Abdomen:  Supple, nontender. Normal bowel sounds. No masses. No hepatomegaly.  Extremities:   Good distal pulses throughout. No lower extremity edema.  Musculoskeletal :moving all extremities.  Neuro:   alert and oriented x3.  CN II-XII grossly intact.   Assessment and Plan:  1. Systolic HF - Nonischemic myopathy.  Echo earlier this month shows LVEF has improved to 45%   Clinicially doing very well.  I would keep on current regimen  Check labs.   Watch salt   F/U in APril  Patient may move to [place] before that  Will give him records to take with him if he does  2. Morbid obesity - Counselled on wt loss, portion control  3. Polysubstance abuse - PAtient says he is not drinking, smoking or using illicit drugs  I do not think with his history that he should try any amount.

## 2013-11-16 LAB — BRAIN NATRIURETIC PEPTIDE: Pro B Natriuretic peptide (BNP): 19 pg/mL (ref 0.0–100.0)

## 2013-11-17 ENCOUNTER — Telehealth: Payer: Self-pay | Admitting: Internal Medicine

## 2013-11-17 NOTE — Telephone Encounter (Signed)
Spoke with pt, aware of normal labs 

## 2013-11-17 NOTE — Telephone Encounter (Signed)
New problem     Pt called to get his lab results 12/15.   Please give pt a call.

## 2013-11-23 ENCOUNTER — Other Ambulatory Visit (HOSPITAL_COMMUNITY): Payer: Self-pay | Admitting: Nurse Practitioner

## 2013-12-24 ENCOUNTER — Other Ambulatory Visit: Payer: Self-pay | Admitting: Internal Medicine

## 2014-02-20 ENCOUNTER — Other Ambulatory Visit (HOSPITAL_COMMUNITY): Payer: Self-pay | Admitting: Nurse Practitioner

## 2014-03-23 ENCOUNTER — Ambulatory Visit: Payer: Medicaid Other | Admitting: Nurse Practitioner

## 2014-03-23 ENCOUNTER — Encounter: Payer: Self-pay | Admitting: Nurse Practitioner

## 2014-03-23 ENCOUNTER — Ambulatory Visit (INDEPENDENT_AMBULATORY_CARE_PROVIDER_SITE_OTHER): Payer: Medicaid Other | Admitting: Nurse Practitioner

## 2014-03-23 VITALS — HR 87 | Ht 69.5 in | Wt 359.8 lb

## 2014-03-23 DIAGNOSIS — R0609 Other forms of dyspnea: Secondary | ICD-10-CM

## 2014-03-23 DIAGNOSIS — R0600 Dyspnea, unspecified: Secondary | ICD-10-CM

## 2014-03-23 DIAGNOSIS — R0989 Other specified symptoms and signs involving the circulatory and respiratory systems: Secondary | ICD-10-CM

## 2014-03-23 DIAGNOSIS — I5022 Chronic systolic (congestive) heart failure: Secondary | ICD-10-CM

## 2014-03-23 DIAGNOSIS — I428 Other cardiomyopathies: Secondary | ICD-10-CM

## 2014-03-23 MED ORDER — POTASSIUM CHLORIDE CRYS ER 20 MEQ PO TBCR: 20.0000 meq | EXTENDED_RELEASE_TABLET | Freq: Every day | ORAL | Status: DC

## 2014-03-23 MED ORDER — CARVEDILOL 25 MG PO TABS: ORAL_TABLET | ORAL | Status: DC

## 2014-03-23 MED ORDER — LISINOPRIL 10 MG PO TABS: ORAL_TABLET | ORAL | Status: DC

## 2014-03-23 MED ORDER — FUROSEMIDE 40 MG PO TABS: ORAL_TABLET | ORAL | Status: DC

## 2014-03-23 MED ORDER — SPIRONOLACTONE 25 MG PO TABS: 25.0000 mg | ORAL_TABLET | Freq: Once | ORAL | Status: DC

## 2014-03-23 NOTE — Progress Notes (Addendum)
Jermaine Herring Date of Birth: 1971/10/05 Medical Record #[id number]  History of Present Illness: Jermaine Herring is seen back today for a 4 month check. Seen for Dr. Tenny Craw. He is a 43 year old male with a NICM with EFpreviously of 30 to 25%, substance abuse (alcohol and marijuana), tobacco abuse, obesity, NSVT, microcytic anemia, Crohn's disease. No CAD per prior cardiac cath in 2014. Follow up echo from December of 2014 showed an improvement in his EF to 40 to 45% - no plans to implant ICD due to improvement in his EF.   Last seen here in December -was doing well.   Comes back today. Here alone. Doing "great". Really feels good. Great energy. Not short of breath at all. Weight is up considerably - says his clothes fit the same. Not drinking. 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 Needs medicines refilled. Will not be coming back here.    Current Outpatient Prescriptions  Medication Sig Dispense Refill  . carvedilol (COREG) 25 MG tablet 1/2 tablet (total 12.5mg ) two times a day      . furosemide (LASIX) 40 MG tablet 1 tablet daily      . lisinopril (PRINIVIL,ZESTRIL) 10 MG tablet TAKE 1 TABLET (10 MG TOTAL) BY MOUTH DAILY.  30 tablet  3  . potassium chloride SA (K-DUR,KLOR-CON) 20 MEQ tablet Take 1 tablet (20 mEq total) by mouth daily.  60 tablet  3  . spironolactone (ALDACTONE) 25 MG tablet Take 1 tablet (25 mg total) by mouth once.  30 tablet  11   No current facility-administered medications for this visit.    Allergies  Allergen Reactions  . Mushroom Extract Complex Anaphylaxis    Past Medical History  Diagnosis Date  . Crohn disease     a. Followed by Dr. Greggory Brandy, [facility].  Marland Kitchen Obesity   . Diverticulitis     a. with h/o perforation and multiple abscesses req loop ileostomy, sigmoid colectomy, and SB resectionx 2 01/2010.  . Tobacco abuse   . History of substance abuse     a. previously drank heavily on weekends and used marijuana daily x 20+ yrs.  Quit both 07/05/2013.    Marland Kitchen Nonischemic cardiomyopathy     a. 08/2013 Cath: Nl cors.  . Chronic systolic CHF (congestive heart failure)     a. 08/2013 Echo: EF 20-25%, diff HK, mod-sev dil LA, mod reduced RV fxn, mod dil RV/RA, mod TR, PASP .  . Microcytic anemia   . NSVT (nonsustained ventricular tachycardia)     a. noted during hospitalization for CHF 08/2013-->bb therapy.    Past Surgical History  Procedure Laterality Date  . Colon surgery    . Ileostomy reversal      History  Smoking status  . Former Smoker -- 0.25 packs/day  . Types: Cigarettes  . Quit date: 08/03/2013  Smokeless tobacco  . Not on file    Comment: previously smoked 1ppd x 20+ yrs, now smoking 4 cigs/day (08/2013).    History  Alcohol Use  . Yes    Comment: previously drank heavily on weekends.  Quit all ETOH on 07/05/2013.    Family History  Problem Relation Age of Onset  . Cancer Mother   . Heart disease Father   . Hypertension Father   . Diabetes Father     Review of Systems: The review of systems is per the HPI.  All other systems were reviewed and are negative.  Physical Exam: Pulse 87  Ht 5'  9.5" (1.765 m)  Wt 359 lb 12.8 oz (163.204 kg)  BMI 52.39 kg/m2  SpO2 96% Patient is very pleasant and in no acute distress. Morbidly obese. He is up 29 pounds since his last visit. Skin is warm and dry. Color is normal.  HEENT is unremarkable. Normocephalic/atraumatic. PERRL. Sclera are nonicteric. Neck is supple. No masses. No JVD. Lungs are clear. Cardiac exam shows a regular rate and rhythm. Abdomen is soft. Extremities are without edema. Gait and ROM are intact. No gross neurologic deficits noted.  Wt Readings from Last 3 Encounters:  03/23/14 359 lb 12.8 oz (163.204 kg)  11/15/13 330 lb 1.9 oz (149.741 kg)  10/20/13 329 lb 12.8 oz (149.596 kg)     LABORATORY DATA: PENDING  Lab Results  Component Value Date   WBC 3.9* 08/03/2013   HGB 11.9* 08/03/2013   HCT 35.9* 08/03/2013   PLT 449* 08/03/2013   GLUCOSE 91  11/15/2013   CHOL  Value: 51        ATP III CLASSIFICATION:  <200     mg/dL   Desirable  [id number]  mg/dL   Borderline High  >=657>=240    mg/dL   High        8/46/96292/28/2011   TRIG 70 01/29/2010   ALT 7 08/03/2013   AST 15 08/03/2013   NA 138 11/15/2013   K 4.0 11/15/2013   CL 103 11/15/2013   CREATININE 1.2 11/15/2013   BUN 15 11/15/2013   CO2 27 11/15/2013   TSH 3.130 08/03/2013   INR 1.14 08/05/2013   Echo Study Conclusions from December 2014  - Left ventricle: The cavity size was severely dilated. The estimated ejection fraction was 45%. Wall motion was normal; there were no regional wall motion abnormalities. Features are consistent with a pseudonormal left ventricular filling pattern, with concomitant abnormal relaxation and increased filling pressure (grade 2 diastolic dysfunction). Doppler parameters are consistent with elevated ventricular end-diastolic filling pressure. - Aortic valve: Mild regurgitation. - Left atrium: The atrium was moderately to severely dilated. - Right atrium: The atrium was mildly dilated. - Atrial septum: No defect or patent foramen ovale was identified. Impressions:  - Compared to the dtudy from 08/04/2013 left ventricular ejection has improved from 20-25% to 45%.   Assessment / Plan: 1. Systolic HF - EF has improved - he is doing well clinically but has had considerable weight gain - not drinking alcohol - will check BNP and BMET today. Refilled his medicines that can then be transferred to a drug store in [place]. See prn.   2. Morbid obesity  3. Substance abuse  Patient is agreeable to this plan and will call if any problems develop in the interim.   Rosalio MacadamiaLori C. Garrell Flagg, RN, ANP-C [facility] [address] [place], [place]  [address] [phone]   Addendum: Phone call today from patient - has moved to [place] - asking about a return to work note. Has gotten a job with [employer] - that should be satisfactory from our standpoint  and he has no restrictions from our standpoint.  Rosalio MacadamiaLori C. Osa Campoli, RN, ANP-C [facility] [address] [place], [place]  [address] [phone]

## 2014-03-23 NOTE — Patient Instructions (Signed)
Go downstairs for your lab work  Stay on your current medicines  I have sent refills in for all of your medicines today  Good luck with your move!!  Call the [facility] office at [phone] if you have any questions, problems or concerns.

## 2014-03-24 ENCOUNTER — Other Ambulatory Visit (HOSPITAL_COMMUNITY): Payer: Self-pay | Admitting: Nurse Practitioner

## 2014-03-24 LAB — BASIC METABOLIC PANEL
BUN/Creatinine Ratio: 11 (ref 9–20)
BUN: 15 mg/dL (ref 6–24)
CO2: 24 mmol/L (ref 18–29)
Calcium: 9.5 mg/dL (ref 8.7–10.2)
Chloride: 100 mmol/L (ref 97–108)
Creatinine, Ser: 1.34 mg/dL — ABNORMAL HIGH (ref 0.76–1.27)
GFR calc Af Amer: 76 mL/min/{1.73_m2} (ref >59)
GFR calc non Af Amer: 66 mL/min/{1.73_m2} (ref >59)
Glucose: 98 mg/dL (ref 65–99)
Potassium: 4.5 mmol/L (ref 3.5–5.2)
Sodium: 140 mmol/L (ref 134–144)

## 2014-03-24 LAB — BRAIN NATRIURETIC PEPTIDE: BNP: 9 pg/mL (ref 0.0–100.0)

## 2014-05-13 ENCOUNTER — Telehealth: Payer: Self-pay | Admitting: Nurse Practitioner

## 2014-05-13 NOTE — Telephone Encounter (Signed)
S/w pt is aware lab results were made in error.  Lawson Fiscal stated pt can go back to work.  Pt is scheduled today to see [employer] doctor and asked our office to fax a letter over # [phone] so pt is cleared to work from our standpoint. I will fax over Lori's office note.

## 2014-05-13 NOTE — Telephone Encounter (Signed)
New message    Need a note stating patient is clear to go back to work with out any restriction.

## 2014-05-13 NOTE — Telephone Encounter (Signed)
New message    Pt request Jermaine Herring to call him.  He would not tell me what he wanted

## 2014-05-18 ENCOUNTER — Telehealth: Payer: Self-pay | Admitting: Nurse Practitioner

## 2014-05-18 NOTE — Telephone Encounter (Signed)
Jermaine Herring received a script in the HIM department on 6.12.15 with only the fax number. She faxed to that number requesting for verification of facility name, physician and contact number. No response was ever received.   Today, we spoke and after researching, the information was sent to the following:  To: [employer] Fax number: [phone] Attention: Candise Bowens

## 2014-11-10 ENCOUNTER — Encounter (HOSPITAL_COMMUNITY): Payer: Self-pay | Admitting: Cardiovascular Disease

## 2014-12-20 ENCOUNTER — Other Ambulatory Visit: Payer: Self-pay | Admitting: Internal Medicine

## 2015-04-01 ENCOUNTER — Other Ambulatory Visit: Payer: Self-pay | Admitting: Nurse Practitioner

## 2015-08-16 ENCOUNTER — Other Ambulatory Visit: Payer: Self-pay | Admitting: Internal Medicine

## 2015-09-25 ENCOUNTER — Other Ambulatory Visit: Payer: Self-pay | Admitting: Internal Medicine

## 2015-12-13 ENCOUNTER — Other Ambulatory Visit: Payer: Self-pay | Admitting: Internal Medicine

## 2015-12-14 NOTE — Telephone Encounter (Signed)
The pt states that he has a new cardiologist in [place] but his 1st appt is not until next month. Per Dr Tenny Craw the pt may have one last 30 day refill on his cardiac medications.  The pt is aware that after these refills we will no longer refill his cardiac meds as he has not had an OV in this office since 4/15. He verbalized understanding.

## 2015-12-14 NOTE — Telephone Encounter (Signed)
Patient has not been here since 2015 and this request is from [place]. Per last office visit patient was moving. Please advise. Thanks, MI
